# Patient Record
Sex: Female | Born: 1980 | Race: White | Hispanic: No | Marital: Single | Smoking: Never smoker
Health system: Southern US, Community
[De-identification: ages and names within clinical notes are randomized; demographics above are authoritative.]

## PROBLEM LIST (undated history)

## (undated) DIAGNOSIS — J45909 Unspecified asthma, uncomplicated: Secondary | ICD-10-CM

## (undated) DIAGNOSIS — U071 COVID-19: Secondary | ICD-10-CM

## (undated) DIAGNOSIS — L309 Dermatitis, unspecified: Secondary | ICD-10-CM

## (undated) HISTORY — DX: COVID-19: U07.1

## (undated) HISTORY — DX: Dermatitis, unspecified: L30.9

## (undated) HISTORY — DX: Unspecified asthma, uncomplicated: J45.909

---

## 2004-09-16 ENCOUNTER — Ambulatory Visit: Payer: Self-pay | Admitting: Cardiology

## 2019-10-10 ENCOUNTER — Ambulatory Visit (INDEPENDENT_AMBULATORY_CARE_PROVIDER_SITE_OTHER): Payer: Medicaid Other | Admitting: Internal Medicine

## 2019-10-10 ENCOUNTER — Encounter: Payer: Self-pay | Admitting: Internal Medicine

## 2019-10-10 ENCOUNTER — Other Ambulatory Visit: Payer: Self-pay

## 2019-10-10 DIAGNOSIS — R0609 Other forms of dyspnea: Secondary | ICD-10-CM | POA: Insufficient documentation

## 2019-10-10 DIAGNOSIS — J45991 Cough variant asthma: Secondary | ICD-10-CM | POA: Diagnosis not present

## 2019-10-10 DIAGNOSIS — R0600 Dyspnea, unspecified: Secondary | ICD-10-CM

## 2019-10-10 MED ORDER — PREDNISONE 10 MG PO TABS: ORAL_TABLET | ORAL | 0 refills | Status: DC

## 2019-10-10 MED ORDER — BENZONATATE 200 MG PO CAPS: 200.0000 mg | ORAL_CAPSULE | Freq: Three times a day (TID) | ORAL | 1 refills | Status: DC | PRN

## 2019-10-10 MED ORDER — PANTOPRAZOLE SODIUM 40 MG PO TBEC: 40.0000 mg | DELAYED_RELEASE_TABLET | Freq: Every day | ORAL | 2 refills | Status: DC

## 2019-10-10 MED ORDER — FAMOTIDINE 20 MG PO TABS: ORAL_TABLET | ORAL | 11 refills | Status: DC

## 2019-10-10 NOTE — Assessment & Plan Note (Signed)
Onset seasonal cough ? Around 2018/ mostly in fall - cyclical cough rx 10/10/2019   Of the three most common causes of  Sub-acute / recurrent or chronic cough, only one (GERD)  can actually contribute to/ trigger  the other two (asthma and post nasal drip syndrome)  and perpetuate the cylce of cough.  While not intuitively obvious, many patients with chronic low grade reflux do not cough until there is a primary insult that disturbs the protective epithelial barrier and exposes sensitive nerve endings.   This is typically viral but can due to PNDS and  either may apply here.     >>> The point is that once this occurs, it is difficult to eliminate the cycle  using anything but a maximally effective acid suppression regimen at least in the short run, accompanied by an appropriate diet to address non acid GERD and control / eliminate the cough itself if possible with tessalon pearls and avoid all cough drops plus added 6 days of Prednisone in case of component of Th-2 driven upper or lower airways inflammation (if cough responds short term only to relapse befor return while will on rx for uacs that would point to allergic rhinitis/ asthma or eos bronchitis)

## 2019-10-10 NOTE — Patient Instructions (Addendum)
Pantoprazole (protonix) 40 mg   Take  30-60 min before first meal of the day and Pepcid (famotidine)  20 mg one after supper  until return to office - this is the best way to tell whether stomach acid is contributing to your problem.     GERD (REFLUX)  is an extremely common cause of respiratory symptoms just like yours , many times with no obvious heartburn at all.    It can be treated with medication, but also with lifestyle changes including elevation of the head of your bed (ideally with 6 -8inch blocks under the headboard of your bed),  Smoking cessation, avoidance of late meals, excessive alcohol, and avoid fatty foods, chocolate, peppermint, colas, red wine, and acidic juices such as orange juice.  NO MINT OR MENTHOL PRODUCTS SO NO COUGH DROPS  USE SUGARLESS CANDY INSTEAD (Jolley ranchers or Stover's or Life Savers) or even ice chips will also do - the key is to swallow to prevent all throat clearing. NO OIL BASED VITAMINS - use powdered substitutes.  Avoid fish oil when coughing.    Prednisone 10 mg take  4 each am x 2 days,   2 each am x 2 days,  1 each am x 2 days and stop    For cough > tessalon 200 mg every 6 hours as needed            Please schedule a follow up office visit in 3 weeks in [place] office , sooner if needed  with all medications /inhalers/ solutions in hand so we can verify exactly what you are taking. This includes all medications from all doctors and over the counters

## 2019-10-10 NOTE — Progress Notes (Signed)
Shelley Richardson, female    DOB: 1981-04-09, 39 y.o.   MRN: [id number]   Brief patient profile:  68 yowf never smoker never resp problem with last IUP 8546 no resp complications with baseline wt low 300's  new onset doe / cough typically in fall but not require rx or inhalers typical fall symptoms in Nov 2020 then xmas 2020 > dec 29th pos covid then admitted [facility] Jan 3rd 2021  rx 02/ abx / steroids / anticoagulants and h2 eval by [facility] 07/27/19 by then had weaned off 02 and rx and rx augmentin/ prednisone since then good days and bad days with doe  With more cough on bad days some better transiently on pred but not consistent referred to pulmonary clinic 10/10/2019 by Dr   Nadara Mustard      History of Present Illness  10/10/2019  Pulmonary/ 1st office eval/Julius Boniface  Chief Complaint  Patient presents with  . Pulmonary Consult    Referred by [facility]. Pt c/o cough, SOB, HA, CP since had Covod 19 in Dec 2020. Her cough is non prod.    Dyspnea:  Maybe 50-100 ft flat  Cough: sporadic,  Worse at hs/ uses lots of cough drops  Sleep: lie flat 2 pillows  SABA use: not using, doesn't help  No obvious day to day or daytime variability or assoc excess/ purulent sputum or mucus plugs or hemoptysis or cp or chest tightness, subjective wheeze or overt sinus or hb symptoms.   Also denies any obvious fluctuation of symptoms with weather or environmental changes or other aggravating or alleviating factors except as outlined above   No unusual exposure hx or h/o childhood pna/ asthma or knowledge of premature birth.  Current Allergies, Complete Past Medical History, Past Surgical History, Family History, and Social History were reviewed in [employer] record.  ROS  The following are not active complaints unless bolded Hoarseness, sore throat, dysphagia, dental problems, itching, sneezing,  nasal congestion or discharge of excess mucus or purulent  secretions, ear ache,   fever, chills, sweats, unintended wt loss or wt gain, classically pleuritic or exertional cp,  orthopnea pnd or arm/hand swelling  or leg swelling, presyncope, palpitations, abdominal pain, anorexia, nausea, vomiting, diarrhea  or change in bowel habits or change in bladder habits, change in stools or change in urine, dysuria, hematuria,  rash, arthralgias, visual complaints, headache, numbness, weakness or ataxia or problems with walking or coordination,  change in mood or  memory.           No past medical history on file.  Outpatient Medications Prior to Visit  Medication Sig Dispense Refill  . Ascorbic Acid (VITAMIN C) 100 MG tablet Take 100 mg by mouth daily.    . cholecalciferol (VITAMIN D3) 25 MCG (1000 UNIT) tablet Take 1,000 Units by mouth daily.    Marland Kitchen zinc gluconate 50 MG tablet Take 50 mg by mouth daily.     No facility-administered medications prior to visit.     Objective:     BP 126/84 (BP Location: Left Arm, Cuff Size: Large)   Pulse 97   Temp (!) 97.3 F (36.3 C) (Temporal)   Wt (!) 360 lb (163.3 kg)   SpO2 97% Comment: on RA  SpO2: 97 %(on RA)   Pleasant amb obese wf nad     HEENT : pt wearing mask not removed for exam due to covid -19 concerns.    NECK :  without JVD/Nodes/TM/ nl carotid  upstrokes bilaterally   LUNGS: no acc muscle use,  Nl contour chest which is clear to A and P bilaterally without cough on insp or exp maneuvers   CV:  RRR  no s3 or murmur or increase in P2, and  Bilateral sym LE swelling but not pitting   ABD:  soft and nontender with nl inspiratory excursion in the supine position. No bruits or organomegaly appreciated, bowel sounds nl  MS:  Nl gait/ ext warm without deformities, calf tenderness, cyanosis or clubbing No obvious joint restrictions   SKIN: warm and dry without lesions    NEURO:  alert, approp, nl sensorium with  no motor or cerebellar deficits apparent.       Labs/ xrays unavailable at ov  due to pt transport restrictions > will schedule for next ov in [place] so we can do everything under one roof.      Assessment   DOE (dyspnea on exertion) Typically assoc with "seasonal cough"  Much worse since covid 19 xmas 2021 - 10/10/2019   Walked RA x 3 laps =  approx 1560ft @ nl pace - stopped due to end of study s sob with sats of 95  % at the end of the study.  Symptoms are markedly disproportionate to objective findings and not clear to what extent this is actually a pulmonary  problem but pt does appear to have difficult to sort out respiratory symptoms of unknown origin for which  DDX  = almost all start with A and  include Adherence, Ace Inhibitors, Acid Reflux, Active Sinus Disease, Alpha 1 Antitripsin deficiency, Anxiety masquerading as Airways dz,  ABPA,  Allergy(esp in young), Aspiration (esp in elderly), Adverse effects of meds,  Active smoking or Vaping, A bunch of PE's/clot burden (a few small clots can't cause this syndrome unless there is already severe underlying pulm or vascular dz with poor reserve),  Anemia or thyroid disorder, plus two Bs  = Bronchiectasis and Beta blocker use..and one C= CHF     Adherence is always the initial "prime suspect" and is a multilayered concern that requires a "trust but verify" approach in every patient - starting with knowing how to use medications, especially inhalers, correctly, keeping up with refills and understanding the fundamental difference between maintenance and prns vs those medications only taken for a very short course and then stopped and not refilled.  - return with all meds in hand using a trust but verify approach to confirm accurate Medication  Reconciliation The principal here is that until we are certain that the  patients are doing what we've asked, it makes no sense to ask them to do more.   ? Acid (or non-acid) GERD > always difficult to exclude as up to 75% of pts in some series report no assoc GI/ Heartburn symptoms>  rec max (24h)  acid suppression and diet restrictions/ reviewed and instructions given in writing.   ? Anxiety/depression/ deconditioning assoc with further wt gain  > usually at the bottom of this list of usual suspects but   may interfere with adherence and also interpretation of response or lack thereof to symptom management which can be quite subjective.   ? Anemia/ thyroid dz > check labs  ? A bunch of PEs > D dimer nl - while a normal  or high normal value (seen commonly in the elderly or chronically ill)  may miss small peripheral pe, the clot burden with sob is moderately high and the d dimer  has  a very high neg pred value if used in this setting but only useful if low  ? chf > check bnp      Cough variant asthma vs UACS  Onset seasonal cough ? Around 2018/ mostly in fall - cyclical cough rx 10/10/2019   Of the three most common causes of  Sub-acute / recurrent or chronic cough, only one (GERD)  can actually contribute to/ trigger  the other two (asthma and post nasal drip syndrome)  and perpetuate the cylce of cough.  While not intuitively obvious, many patients with chronic low grade reflux do not cough until there is a primary insult that disturbs the protective epithelial barrier and exposes sensitive nerve endings.   This is typically viral but can due to PNDS and  either may apply here.     >>> The point is that once this occurs, it is difficult to eliminate the cycle  using anything but a maximally effective acid suppression regimen at least in the short run, accompanied by an appropriate diet to address non acid GERD and control / eliminate the cough itself if possible with tessalon pearls and avoid all cough drops plus added 6 days of Prednisone in case of component of Th-2 driven upper or lower airways inflammation (if cough responds short term only to relapse befor return while will on rx for uacs that would point to allergic rhinitis/ asthma or eos bronchitis)        Morbid obesity due to excess calories (HCC) Baseline wt around 300 lb after last IUP around 2013   Contributing to gerd risk/ doe/reviewed the need and the process to achieve and maintain neg calorie balance > defer f/u primary care including intermittently monitoring thyroid status        Each maintenance medication was reviewed in detail including emphasizing most importantly the difference between maintenance and prns and under what circumstances the prns are to be triggered using an action plan format where appropriate.  Total time for H and P, chart review, counseling,  directly observing portions of ambulatory 02 saturation study/  and generating customized AVS unique to this office visit / charting = 60 min        Sandrea Hughs, MD 10/10/2019

## 2019-10-10 NOTE — Assessment & Plan Note (Signed)
Baseline wt around 300 lb after last IUP around 2013   Contributing to gerd risk/ doe/reviewed the need and the process to achieve and maintain neg calorie balance > defer f/u primary care including intermittently monitoring thyroid status        Each maintenance medication was reviewed in detail including emphasizing most importantly the difference between maintenance and prns and under what circumstances the prns are to be triggered using an action plan format where appropriate.  Total time for H and P, chart review, counseling,  directly observing portions of ambulatory 02 saturation study/  and generating customized AVS unique to this office visit / charting = 60 min

## 2019-10-10 NOTE — Assessment & Plan Note (Signed)
Typically assoc with "seasonal cough"  Much worse since covid 19 xmas 2021 - 10/10/2019   Walked RA x 3 laps =  approx 1572ft @ nl pace - stopped due to end of study s sob with sats of 95  % at the end of the study.  Symptoms are markedly disproportionate to objective findings and not clear to what extent this is actually a pulmonary  problem but pt does appear to have difficult to sort out respiratory symptoms of unknown origin for which  DDX  = almost all start with A and  include Adherence, Ace Inhibitors, Acid Reflux, Active Sinus Disease, Alpha 1 Antitripsin deficiency, Anxiety masquerading as Airways dz,  ABPA,  Allergy(esp in young), Aspiration (esp in elderly), Adverse effects of meds,  Active smoking or Vaping, A bunch of PE's/clot burden (a few small clots can't cause this syndrome unless there is already severe underlying pulm or vascular dz with poor reserve),  Anemia or thyroid disorder, plus two Bs  = Bronchiectasis and Beta blocker use..and one C= CHF     Adherence is always the initial "prime suspect" and is a multilayered concern that requires a "trust but verify" approach in every patient - starting with knowing how to use medications, especially inhalers, correctly, keeping up with refills and understanding the fundamental difference between maintenance and prns vs those medications only taken for a very short course and then stopped and not refilled.  - return with all meds in hand using a trust but verify approach to confirm accurate Medication  Reconciliation The principal here is that until we are certain that the  patients are doing what we've asked, it makes no sense to ask them to do more.   ? Acid (or non-acid) GERD > always difficult to exclude as up to 75% of pts in some series report no assoc GI/ Heartburn symptoms> rec max (24h)  acid suppression and diet restrictions/ reviewed and instructions given in writing.   ? Anxiety/depression/ deconditioning assoc with further wt  gain  > usually at the bottom of this list of usual suspects but   may interfere with adherence and also interpretation of response or lack thereof to symptom management which can be quite subjective.   ? Anemia/ thyroid dz > check labs  ? A bunch of PEs > D dimer nl - while a normal  or high normal value (seen commonly in the elderly or chronically ill)  may miss small peripheral pe, the clot burden with sob is moderately high and the d dimer  has a very high neg pred value if used in this setting but only useful if low  ? chf > check bnp

## 2019-11-07 ENCOUNTER — Ambulatory Visit: Payer: Medicaid Other | Admitting: Internal Medicine

## 2019-11-18 ENCOUNTER — Other Ambulatory Visit: Payer: Self-pay

## 2019-11-18 ENCOUNTER — Ambulatory Visit: Payer: Medicaid Other

## 2019-11-18 ENCOUNTER — Ambulatory Visit (INDEPENDENT_AMBULATORY_CARE_PROVIDER_SITE_OTHER): Payer: Medicaid Other | Admitting: Internal Medicine

## 2019-11-18 ENCOUNTER — Encounter: Payer: Self-pay | Admitting: Internal Medicine

## 2019-11-18 DIAGNOSIS — J45991 Cough variant asthma: Secondary | ICD-10-CM | POA: Diagnosis not present

## 2019-11-18 DIAGNOSIS — R0609 Other forms of dyspnea: Secondary | ICD-10-CM

## 2019-11-18 DIAGNOSIS — R0600 Dyspnea, unspecified: Secondary | ICD-10-CM

## 2019-11-18 NOTE — Progress Notes (Signed)
Shelley Richardson, female    DOB: [birth year], 39 y.o.   MRN: [id number]   Brief patient profile:  67 yowf never smoker never resp problem with last IUP 2013 no resp complications with baseline wt low 300's  new onset doe / cough typically in fall but not require rx or inhalers typical fall symptoms in Nov 2020 then xmas 2020 > dec 29th pos covid then admitted [facility] Jan 3rd 2021  rx 02/ abx / steroids / anticoagulants and h2 eval by [facility] 07/27/19 by then had weaned off 02 and rx and rx augmentin/ prednisone since then good days and bad days with doe  With more cough on bad days some better transiently on pred but not consistent referred to pulmonary clinic 10/10/2019 by Dr   Dimas Aguas      History of Present Illness  10/10/2019  Pulmonary/ 1st office eval/Shelley Richardson  Chief Complaint  Patient presents with  . Pulmonary Consult    Referred by [facility]. Pt c/o cough, SOB, HA, CP since had Covod 19 in Dec 2020. Her cough is non prod.    Dyspnea:  Maybe 50-100 ft flat  Cough: sporadic,  Worse at hs/ uses lots of cough drops  Sleep: lie flat 2 pillows  SABA use: not using, doesn't help rec Pantoprazole (protonix) 40 mg   Take  30-60 min before first meal of the day and Pepcid (famotidine)  20 mg one after supper  until return to office - this is the best way to tell whether stomach acid is contributing to your problem.   GERD diet  Prednisone 10 mg take  4 each am x 2 days,   2 each am x 2 days,  1 each am x 2 days and stop  For cough > tessalon 200 mg every 6 hours as needed     11/18/2019  f/u ov/Shelley Richardson re: post covid cough  Chief Complaint  Patient presents with  . Follow-up    pt states sob wwhen walking long distance.  Dyspnea:  Making slow progress Cough: better / some tickle /some sensation of pills getting stuck Sleeping: flat with 2 pillows  SABA use: none 02: none    No obvious day to day or daytime variability or assoc excess/ purulent sputum or  mucus plugs or hemoptysis or cp or chest tightness, subjective wheeze or overt sinus or hb symptoms.   Sleeping as above without nocturnal  or early am exacerbation  of respiratory  c/o's or need for noct saba. Also denies any obvious fluctuation of symptoms with weather or environmental changes or other aggravating or alleviating factors except as outlined above   No unusual exposure hx or h/o childhood pna/ asthma or knowledge of premature birth.  Current Allergies, Complete Past Medical History, Past Surgical History, Family History, and Social History were reviewed in [employer] record.  ROS  The following are not active complaints unless bolded Hoarseness, sore throat, dysphagia, dental problems, itching, sneezing,  nasal congestion or discharge of excess mucus or purulent secretions, ear ache,   fever, chills, sweats, unintended wt loss or wt gain, classically pleuritic or exertional cp,  orthopnea pnd or arm/hand swelling  or leg swelling, presyncope, palpitations, abdominal pain, anorexia, nausea, vomiting, diarrhea  or change in bowel habits or change in bladder habits, change in stools or change in urine, dysuria, hematuria,  rash, arthralgias, visual complaints, headache, numbness, weakness or ataxia or problems with walking or coordination,  change in mood  or  memory.        Current Meds  Medication Sig  . Ascorbic Acid (VITAMIN C) 100 MG tablet Take 100 mg by mouth daily.  . benzonatate (TESSALON) 200 MG capsule Take 1 capsule (200 mg total) by mouth 3 (three) times daily as needed for cough.  . cholecalciferol (VITAMIN D3) 25 MCG (1000 UNIT) tablet Take 1,000 Units by mouth daily.  . famotidine (PEPCID) 20 MG tablet One after supper  . zinc gluconate 50 MG tablet Take 50 mg by mouth daily.                        Objective:     amb obese wf nad   Wt Readings from Last 3 Encounters:  11/18/19 (!) 357 lb 4.8 oz (162.1 kg)  10/10/19 (!) 360 lb  (163.3 kg)     Vital signs reviewed - Note on arrival 02 sats  95% on RA      HEENT : pt wearing mask not removed for exam due to covid -19 concerns.    NECK :  without JVD/Nodes/TM/ nl carotid upstrokes bilaterally   LUNGS: no acc muscle use,  Nl contour chest which is clear to A and P bilaterally without cough on insp or exp maneuvers   CV:  RRR  no s3 or murmur or increase in P2, and no edema   ABD:  Quite obese but soft and nontender with limited insp excursion position. No bruits or organomegaly appreciated, bowel sounds nl  MS:  Nl gait/ ext warm without deformities, calf tenderness, cyanosis or clubbing No obvious joint restrictions   SKIN: warm and dry without lesions    NEURO:  alert, approp, nl sensorium with  no motor or cerebellar deficits apparent.    CXR PA and Lateral:   11/18/2019 :   Ordered       did not go for cxr due to transportation issues - could not be done in office due to wt limitations.              Assessment

## 2019-11-18 NOTE — Patient Instructions (Signed)
To get the most out of exercise, you need to be continuously aware that you are short of breath, but never out of breath, for at least 20 minutes daily. As you improve, it will actually be easier for you to do the same amount of exercise  in  30 minutes so always push to the level where you are short of breath.    No change in medications  Please remember to go to the  x-ray department  for your tests - we will call you with the results when they are available    Please schedule a follow up office visit in 8  weeks, sooner if needed

## 2019-11-19 ENCOUNTER — Encounter: Payer: Self-pay | Admitting: Internal Medicine

## 2019-11-19 NOTE — Assessment & Plan Note (Signed)
Baseline wt around 300 lb a plast IUP around 2013   Body mass index is 63.29 kg/m.  -  trending down slightly/ re-inforced No results found for: TSH   Contributing to gerd risk/ doe/reviewed the need and the process to achieve and maintain neg calorie balance > defer f/u primary care including intermittently monitoring thyroid status

## 2019-11-19 NOTE — Assessment & Plan Note (Signed)
Onset seasonal cough ? Around 2018/ mostly in fall - cyclical cough rx 10/10/2019   May benefit from allergy w/u when lab open again but she has transportation issues and all studies need to be done "in house"   Re-evaluate in 2 months         Each maintenance medication was reviewed in detail including emphasizing most importantly the difference between maintenance and prns and under what circumstances the prns are to be triggered using an action plan format where appropriate.  Total time for H and P, chart review, counseling,  directly observing portions of ambulatory 02 saturation study/  and generating customized AVS unique to this office visit / charting = 20 min

## 2019-11-19 NOTE — Assessment & Plan Note (Signed)
Typically assoc with "seasonal cough"  Much worse since covid 19 xmas 2021 - 10/10/2019   Walked RA x 3 laps =  approx 567ft @ nl pace - stopped due to end of study s sob with sats of 95  % at the end of the study. -  11/18/2019   Walked RA 3 laps @ approx 247ft each @ slow pace  stopped due to end of study, not limited by sob and sats 97% at end  Clearly improving at this point and probably  Does not need further w/u.  rec reg submax ex up to 30 min daily as tol and f/u in 2 months

## 2019-11-24 ENCOUNTER — Other Ambulatory Visit: Payer: Self-pay | Admitting: Internal Medicine

## 2019-11-24 DIAGNOSIS — J45991 Cough variant asthma: Secondary | ICD-10-CM

## 2019-11-28 ENCOUNTER — Ambulatory Visit (INDEPENDENT_AMBULATORY_CARE_PROVIDER_SITE_OTHER)
Admission: RE | Admit: 2019-11-28 | Discharge: 2019-11-28 | Disposition: A | Discharge status: Home / Self Care | Payer: Medicaid Other | Source: Ambulatory Visit | Attending: Internal Medicine | Admitting: Internal Medicine

## 2019-11-28 ENCOUNTER — Other Ambulatory Visit: Payer: Self-pay

## 2019-11-28 DIAGNOSIS — R0609 Other forms of dyspnea: Secondary | ICD-10-CM

## 2019-11-28 DIAGNOSIS — R0600 Dyspnea, unspecified: Secondary | ICD-10-CM

## 2019-11-29 NOTE — Progress Notes (Signed)
Called and left detailed msg on machine ok per DPR.

## 2019-12-22 ENCOUNTER — Other Ambulatory Visit: Payer: Self-pay | Admitting: Internal Medicine

## 2019-12-22 DIAGNOSIS — J45991 Cough variant asthma: Secondary | ICD-10-CM

## 2020-01-06 ENCOUNTER — Ambulatory Visit (INDEPENDENT_AMBULATORY_CARE_PROVIDER_SITE_OTHER): Payer: Medicaid Other | Admitting: Internal Medicine

## 2020-01-06 ENCOUNTER — Encounter: Payer: Self-pay | Admitting: Internal Medicine

## 2020-01-06 ENCOUNTER — Other Ambulatory Visit: Payer: Self-pay

## 2020-01-06 DIAGNOSIS — R0600 Dyspnea, unspecified: Secondary | ICD-10-CM | POA: Diagnosis not present

## 2020-01-06 DIAGNOSIS — J45991 Cough variant asthma: Secondary | ICD-10-CM | POA: Diagnosis not present

## 2020-01-06 DIAGNOSIS — R0609 Other forms of dyspnea: Secondary | ICD-10-CM

## 2020-01-06 LAB — URINALYSIS
Bilirubin Urine: NEGATIVE
Hgb urine dipstick: NEGATIVE
Ketones, ur: NEGATIVE
Leukocytes,Ua: NEGATIVE
Nitrite: NEGATIVE
Specific Gravity, Urine: 1.025 (ref 1.000–1.030)
Total Protein, Urine: NEGATIVE
Urine Glucose: NEGATIVE
Urobilinogen, UA: 0.2 (ref 0.0–1.0)
pH: 5.5 (ref 5.0–8.0)

## 2020-01-06 LAB — CBC WITH DIFFERENTIAL/PLATELET
Basophils Absolute: 0 10*3/uL (ref 0.0–0.1)
Basophils Relative: 0.6 % (ref 0.0–3.0)
Eosinophils Absolute: 0.1 10*3/uL (ref 0.0–0.7)
Eosinophils Relative: 1.5 % (ref 0.0–5.0)
HCT: 36.9 % (ref 36.0–46.0)
Hemoglobin: 12.1 g/dL (ref 12.0–15.0)
Lymphocytes Relative: 33 % (ref 12.0–46.0)
Lymphs Abs: 2.8 10*3/uL (ref 0.7–4.0)
MCHC: 32.9 g/dL (ref 30.0–36.0)
MCV: 90.1 fl (ref 78.0–100.0)
Monocytes Absolute: 0.5 10*3/uL (ref 0.1–1.0)
Monocytes Relative: 6 % (ref 3.0–12.0)
Neutro Abs: 5 10*3/uL (ref 1.4–7.7)
Neutrophils Relative %: 58.9 % (ref 43.0–77.0)
Platelets: 340 10*3/uL (ref 150.0–400.0)
RBC: 4.1 Mil/uL (ref 3.87–5.11)
RDW: 14.5 % (ref 11.5–15.5)
WBC: 8.4 10*3/uL (ref 4.0–10.5)

## 2020-01-06 LAB — SEDIMENTATION RATE: Sed Rate: 9 mm/hr (ref 0–20)

## 2020-01-06 LAB — BASIC METABOLIC PANEL
BUN: 12 mg/dL (ref 6–23)
CO2: 27 mEq/L (ref 19–32)
Calcium: 9.3 mg/dL (ref 8.4–10.5)
Chloride: 105 mEq/L (ref 96–112)
Creatinine, Ser: 0.82 mg/dL (ref 0.40–1.20)
GFR: 77.66 mL/min (ref >60.00)
Glucose, Bld: 111 mg/dL — ABNORMAL HIGH (ref 70–99)
Potassium: 4.1 mEq/L (ref 3.5–5.1)
Sodium: 139 mEq/L (ref 135–145)

## 2020-01-06 LAB — TSH: TSH: 2.48 u[IU]/mL (ref 0.35–4.50)

## 2020-01-06 LAB — BRAIN NATRIURETIC PEPTIDE: Pro B Natriuretic peptide (BNP): 22 pg/mL (ref 0.0–100.0)

## 2020-01-06 MED ORDER — FAMOTIDINE 20 MG PO TABS: ORAL_TABLET | ORAL | 11 refills | Status: DC

## 2020-01-06 MED ORDER — BENZONATATE 200 MG PO CAPS: ORAL_CAPSULE | ORAL | 0 refills | Status: DC

## 2020-01-06 NOTE — Patient Instructions (Addendum)
Stop pantoprazole and start taking pepcid (famotidine) after first meal and last meal.   Please remember to go to the lab department   for your tests - we will call you with the results when they are available.   Please schedule a follow up office visit in 6 weeks, call sooner if needed in [place] office

## 2020-01-06 NOTE — Progress Notes (Signed)
Shelley Richardson, female    DOB: Mar 17, 1981, 39 y.o.   MRN: [id number]   Brief patient profile:  48 yowf never smoker never resp problem with last IUP 2013 no resp complications with baseline wt low 300's  new onset doe / cough typically in fall but not require rx or inhalers typical fall symptoms in Nov 2020 then xmas 2020 > dec 29th pos covid then admitted [facility] Jan 3rd 2021  rx 02/ abx / steroids / anticoagulants and h2 eval by [facility] 07/27/19 by then had weaned off 02 and rx and rx augmentin/ prednisone since then good days and bad days with doe  With more cough on bad days some better transiently on pred but not consistent referred to pulmonary clinic 10/10/2019 by Dr   Dimas Aguas      History of Present Illness  10/10/2019  Pulmonary/ 1st office eval/Sunnie Odden  p covid Dec 2020  Chief Complaint  Patient presents with  . Pulmonary Consult    Referred by [facility]. Pt c/o cough, SOB, HA, CP since had Covod 19 in Dec 2020. Her cough is non prod.    Dyspnea:  Maybe 50-100 ft flat  Cough: sporadic,  Worse at hs/ uses lots of cough drops  Sleep: lie flat 2 pillows  SABA use: not using, doesn't help rec Pantoprazole (protonix) 40 mg   Take  30-60 min before first meal of the day and Pepcid (famotidine)  20 mg one after supper  until return to office - this is the best way to tell whether stomach acid is contributing to your problem.   GERD diet  Prednisone 10 mg take  4 each am x 2 days,   2 each am x 2 days,  1 each am x 2 days and stop  For cough > tessalon 200 mg every 6 hours as needed     11/18/2019  f/u ov/[facility] re: post covid cough  Chief Complaint  Patient presents with  . Follow-up    pt states sob wwhen walking long distance.  Dyspnea:  Making slow progress Cough: better / some tickle /some sensation of pills getting stuck Sleeping: flat with 2 pillows  SABA use: none 02: none  rec To get the most out of exercise, you need to be continuously  aware that you are short of breath, but never out of breath, for at least 20 minutes daily. As you improve, it will actually be easier for you to do the same amount of exercise  in  30 minutes so always push to the level where you are short of breath.   No change in medications Please remember to go to the  x-ray department  for your tests - we will call you with the results when they are available   Please schedule a follow up office visit in 8  weeks, sooner if needed      01/06/2020  f/u ov/Allora Bains re: post covid Dec 2020  Chief Complaint  Patient presents with  . Follow-up   cp x years lasts from sev  hours to all day  Worse with  drinking cold drinks /with deep breath / not noct  Dyspnea: not very active gets let off at door but better since last ov  Cough: off and on s pattern, no excess mucus   Sleeping: on side /not using bed blocks  SABA use: none  02:  None    No obvious day to day or daytime variability or assoc  excess/ purulent sputum or mucus plugs or hemoptysis   or chest tightness, subjective wheeze or overt sinus or hb symptoms.   Sleeping  without nocturnal  or early am exacerbation  of respiratory  c/o's or need for noct saba. Also denies any obvious fluctuation of symptoms with weather or environmental changes or other aggravating or alleviating factors except as outlined above   No unusual exposure hx or h/o childhood pna/ asthma or knowledge of premature birth.  Current Allergies, Complete Past Medical History, Past Surgical History, Family History, and Social History were reviewed in [employer] record.  ROS  The following are not active complaints unless bolded Hoarseness, sore throat, dysphagia= globus sensatin , dental problems, itching, sneezing,  nasal congestion or discharge of excess mucus or purulent secretions, ear ache,   fever, chills, sweats, unintended wt loss or wt gain, classically exertional cp,  orthopnea pnd or arm/hand swelling   or leg swelling, presyncope, palpitations, abdominal pain, anorexia, nausea, vomiting, diarrhea  or change in bowel habits or change in bladder habits=nocturia, change in stools or change in urine, dysuria, hematuria,  rash, arthralgias, visual complaints, headache, numbness, weakness or ataxia or problems with walking or coordination,  change in mood or  memory.        Current Meds  Medication Sig  . Ascorbic Acid (VITAMIN C) 100 MG tablet Take 100 mg by mouth daily.  . benzonatate (TESSALON) 200 MG capsule Take 1 capsule by mouth three times daily as needed for cough  . cholecalciferol (VITAMIN D3) 25 MCG (1000 UNIT) tablet Take 1,000 Units by mouth daily.  . famotidine (PEPCID) 20 MG tablet One after supper  . pantoprazole (PROTONIX) 40 MG tablet Take 1 tablet (40 mg total) by mouth daily. Take 30-60 min before first meal of the day  . zinc gluconate 50 MG tablet Take 50 mg by mouth daily.                    Objective:       Obese wf nad    01/06/2020       356   11/18/19 (!) 357 lb 4.8 oz (162.1 kg)  10/10/19 (!) 360 lb (163.3 kg)      Vital signs reviewed  01/06/2020  - Note at rest 02 sats  100% on RA   HEENT : pt wearing mask not removed for exam due to covid -19 concerns.    NECK :  without JVD/Nodes/TM/ nl carotid upstrokes bilaterally   LUNGS: no acc muscle use,  Nl contour chest which is clear to A and P bilaterally without cough on insp or exp maneuvers   CV:  RRR  no s3 or murmur or increase in P2, and trace pitting both LEs   ABD:  Obese/ soft and nontender with very limited inspiratory excursion No bruits or organomegaly appreciated, bowel sounds nl  MS:  Nl gait/ ext warm without deformities, calf tenderness, cyanosis or clubbing No obvious joint restrictions   SKIN: warm and dry without lesions    NEURO:  alert, approp, nl sensorium with  no motor or cerebellar deficits apparent.       Labs ordered/ reviewed:      Chemistry      Component  Value Date/Time   NA 139 01/06/2020 1009   K 4.1 01/06/2020 1009   CL 105 01/06/2020 1009   CO2 27 01/06/2020 1009   BUN 12 01/06/2020 1009   CREATININE 0.82 01/06/2020 1009  Component Value Date/Time   CALCIUM 9.3 01/06/2020 1009        Lab Results  Component Value Date   WBC 8.4 01/06/2020   HGB 12.1 01/06/2020   HCT 36.9 01/06/2020   MCV 90.1 01/06/2020   PLT 340.0 01/06/2020       EOS                                                              0.1                                     01/06/2020      Lab Results  Component Value Date   TSH 2.48 01/06/2020     Lab Results  Component Value Date   PROBNP 22.0 01/06/2020       Lab Results  Component Value Date   ESRSEDRATE 9 01/06/2020       U/a 01/06/2020  Neg   Labs ordered 01/06/2020  :  allergy profile       Assessment

## 2020-01-08 ENCOUNTER — Encounter: Payer: Self-pay | Admitting: Internal Medicine

## 2020-01-08 NOTE — Assessment & Plan Note (Signed)
Baseline wt around 300 lb a plast IUP around 2013   Body mass index is 63.17 kg/m.  -  trending no change  Lab Results  Component Value Date   TSH 2.48 01/06/2020     Contributing to gerd risk/ doe/reviewed the need and the process to achieve and maintain neg calorie balance > defer f/u primary care including intermittently monitoring thyroid status      Medical decision making was a moderate level of complexity in this case because of  two chronic conditions /diagnoses requiring extra time for  H and P, chart review, counseling,   and generating customized AVS unique to this office visit and charting.   Each maintenance medication was reviewed in detail including emphasizing most importantly the difference between maintenance and prns and under what circumstances the prns are to be triggered using an action plan format where appropriate. Please see avs for details which were reviewed in writing by both me and my nurse and patient given a written copy highlighted where appropriate with yellow highlighter for the patient's continued care at home along with an updated version of their medications.  Patient was asked to maintain medication reconciliation by comparing this list to the actual medications being used at home and to contact this office right away if there is a conflict or discrepancy.      Marland Kitchen

## 2020-01-08 NOTE — Assessment & Plan Note (Signed)
Typically assoc with "seasonal cough"  Much worse since covid 19 xmas 2021 - 10/10/2019   Walked RA x 3 laps =  approx 517ft @ nl pace - stopped due to end of study s sob with sats of 95  % at the end of the study. -  11/18/2019   Walked RA 3 laps @ approx 237ft each @ slow pace  stopped due to end of study, not limited by sob and sats 97% at end    No evidence of chf/ anemia/ thyroid dz/ renal failure  Strongly suspect all related to obesity/ deconditioning/ advised

## 2020-01-08 NOTE — Assessment & Plan Note (Signed)
Onset seasonal cough ? Around 2018/ mostly in fall - cyclical cough rx 10/10/2019 > improved - Allergy profile 01/06/2020 >  Eos 0.1 /  IgE   - 01/06/2020 try changing ppi to h2 bid   Discussed the recent press about ppi's in the context of a statistically significant (but questionably clinically relevant) increase in CRI in pts on ppi vs h2's > bottom line is the lowest dose of ppi that controls   gerd is the right dose and if that dose is zero that's fine esp since h2's are cheaper.

## 2020-01-09 LAB — IGE: IgE (Immunoglobulin E), Serum: 7 kU/L (ref <=114)

## 2020-01-10 NOTE — Progress Notes (Signed)
Called and left detailed msg on machine OK per DPR

## 2020-02-17 ENCOUNTER — Encounter: Payer: Self-pay | Admitting: Internal Medicine

## 2020-02-17 ENCOUNTER — Other Ambulatory Visit: Payer: Self-pay

## 2020-02-17 ENCOUNTER — Ambulatory Visit: Payer: Medicaid Other | Admitting: Internal Medicine

## 2020-02-17 VITALS — BP 138/80 | HR 87 | Temp 97.8°F | Ht 63.0 in | Wt 353.0 lb

## 2020-02-17 DIAGNOSIS — J45991 Cough variant asthma: Secondary | ICD-10-CM

## 2020-02-17 DIAGNOSIS — R609 Edema, unspecified: Secondary | ICD-10-CM | POA: Diagnosis not present

## 2020-02-17 DIAGNOSIS — R0609 Other forms of dyspnea: Secondary | ICD-10-CM

## 2020-02-17 DIAGNOSIS — R0600 Dyspnea, unspecified: Secondary | ICD-10-CM

## 2020-02-17 MED ORDER — SPIRONOLACTONE-HCTZ 25-25 MG PO TABS: ORAL_TABLET | ORAL | 11 refills | Status: AC

## 2020-02-17 NOTE — Assessment & Plan Note (Signed)
Likely has venous insufficiency due to obesity, no evidence DVT but  At risk/ advised   >>> aldactazide 25-25  1-2 daily and f/u pcp

## 2020-02-17 NOTE — Progress Notes (Signed)
Shelley Richardson, female    DOB: [birth year], 39 y.o.   MRN: [id number]   Brief patient profile:  73 yowf never smoker never resp problem with last IUP 2013 no resp complications with baseline wt low 300's the around w/in a year of last IUP   new onset doe / cough typically in fall but not require rx or inhalers typical fall symptoms in Nov 2020 then xmas 2020 > dec 29th pos covid then admitted [facility] Jan 3rd 2021  rx 02/ abx / steroids / anticoagulants and h2 eval by [facility] 07/27/19 by then had weaned off 02 and rx and rx augmentin/ prednisone since then good days and bad days with doe  With more cough on bad days some better transiently on pred but not consistent referred to pulmonary clinic 10/10/2019 by Dr   Dimas Aguas      History of Present Illness  10/10/2019  Pulmonary/ 1st office eval/Shelley Richardson  p covid Dec 2020  Chief Complaint  Patient presents with  . Pulmonary Consult    Referred by [facility]. Pt c/o cough, SOB, HA, CP since had Covod 19 in Dec 2020. Her cough is non prod.    Dyspnea:  Maybe 50-100 ft flat  Cough: sporadic,  Worse at hs/ uses lots of cough drops  Sleep: lie flat 2 pillows  SABA use: not using, doesn't help rec Pantoprazole (protonix) 40 mg   Take  30-60 min before first meal of the day and Pepcid (famotidine)  20 mg one after supper  until return to office - this is the best way to tell whether stomach acid is contributing to your problem.   GERD diet  Prednisone 10 mg take  4 each am x 2 days,   2 each am x 2 days,  1 each am x 2 days and stop  For cough > tessalon 200 mg every 6 hours as needed     11/18/2019  f/u ov/Shelley Richardson re: post covid cough  Chief Complaint  Patient presents with  . Follow-up    pt states sob wwhen walking long distance.  Dyspnea:  Making slow progress Cough: better / some tickle /some sensation of pills getting stuck Sleeping: flat with 2 pillows  SABA use: none 02: none  rec To get the most out of  exercise, you need to be continuously aware that you are short of breath, but never out of breath, for at least 20 minutes daily. As you improve, it will actually be easier for you to do the same amount of exercise  in  30 minutes so always push to the level where you are short of breath.   No change in medications Please remember to go to the  x-ray department  for your tests - we will call you with the results when they are available   Please schedule a follow up office visit in 8  weeks, sooner if needed      01/06/2020  f/u ov/Shelley Richardson re: post covid Dec 2020  Chief Complaint  Patient presents with  . Follow-up   cp x years lasts from sev  hours to all day  Worse with  drinking cold drinks /with deep breath / not noct  Dyspnea: not very active gets let off at door but better since last ov  Cough: off and on s pattern, no excess mucus   Sleeping: on side /not using bed blocks  SABA use: none  02:  None  rec Stop pantopzole  and start taking pepcid (famotidine) after first meal and last meal. Please remember to go to the lab department   for your tests - we will call you with the results when they are available.       02/17/2020  f/u ov/[name] office/Shelley Richardson re:  Doe/ cough  And chest pain all predated covid by years now with leg swelling worse  Chief Complaint  Patient presents with  . Follow-up    Patient is still having some chest tightness, hurts to breath and feels like she is swollen. Shortness of breath with exertion. Covid in December. Cough is better with the tessalon perles.  Dyspnea: x 7 years / Cough: better less need for tessalon Sleeping: flat surface / one pillow / bed blocks not being used wakes up sev times a week with cough/ wheeze, sob and helps to sit up  SABA use: none  02: none  Cp midline x couple of years sometimes all day long = chest tightness worse since covid but present before covid seen in ER [facility] ? Better p prednisone > records req    No obvious day  to day or daytime variability or assoc excess/ purulent sputum or mucus plugs or hemoptysis or , subjective wheeze or overt sinus or hb symptoms.   sleepi without nocturnal  or early am exacerbation  of respiratory  c/o's or need for noct saba. Also denies any obvious fluctuation of symptoms with weather or environmental changes or other aggravating or alleviating factors except as outlined above   No unusual exposure hx or h/o childhood pna/ asthma or knowledge of premature birth.  Current Allergies, Complete Past Medical History, Past Surgical History, Family History, and Social History were reviewed in [employer] record.  ROS  The following are not active complaints unless bolded Hoarseness, sore throat, dysphagia, dental problems, itching, sneezing,  nasal congestion or discharge of excess mucus or purulent secretions, ear ache,   fever, chills, sweats, unintended wt loss or wt gain, classically pleuritic or exertional cp,  orthopnea pnd or arm/hand swelling  or leg swelling, presyncope, palpitations, abdominal pain, anorexia, nausea, vomiting, diarrhea  or change in bowel habits or change in bladder habits, change in stools or change in urine, dysuria, hematuria,  rash, arthralgias, visual complaints, headache, numbness, weakness or ataxia or problems with walking or coordination,  change in mood or  memory.        Current Meds  Medication Sig  . Ascorbic Acid (VITAMIN C) 100 MG tablet Take 100 mg by mouth daily.  . benzonatate (TESSALON) 200 MG capsule Take 1 capsule by mouth three times daily as needed for cough  . cholecalciferol (VITAMIN D3) 25 MCG (1000 UNIT) tablet Take 1,000 Units by mouth daily.  . famotidine (PEPCID) 20 MG tablet One after bfast and supper  . zinc gluconate 50 MG tablet Take 50 mg by mouth daily.         Objective:      obese somber amb wf nad    02/17/2020        353   01/06/2020       356   11/18/19 (!) 357 lb 4.8 oz (162.1 kg)   10/10/19 (!) 360 lb (163.3 kg)    Vital signs reviewed  02/17/2020  - Note at rest 02 sats  97% on RA       HEENT : pt wearing mask not removed for exam due to covid -19 concerns.    NECK :  without JVD/Nodes/TM/ nl carotid upstrokes bilaterally   LUNGS: no acc muscle use,  Nl contour chest which is clear to A and P bilaterally without cough on insp or exp maneuvers   CV:  RRR  no s3 or murmur or increase in P2, and 1-2+ pitting both ankles  ABD:  soft and nontender with nl inspiratory excursion in the supine position. No bruits or organomegaly appreciated, bowel sounds nl  MS:  Nl gait/ ext warm without deformities, calf tenderness, cyanosis or clubbing No obvious joint restrictions   SKIN: warm and dry without lesions    NEURO:  alert, approp, nl sensorium with  no motor or cerebellar deficits apparent.             Assessment

## 2020-02-17 NOTE — Patient Instructions (Addendum)
Aldactazide 25-25 take   1 or 2 each am and avoid salt and salty food   I will check your records from [facility] for your chest pain prior to covid 2019-2020  Please schedule a follow up office visit in 6-8  Weeks with PFTs on return

## 2020-02-17 NOTE — Assessment & Plan Note (Signed)
Onset seasonal cough ? Around 2018/ mostly in fall - cyclical cough rx 10/10/2019 > improved - Allergy profile 01/06/2020 >  Eos 0.1 /  IgE  7 - 01/06/2020 try changing ppi to h2 bid > no worse off PPI as of 02/17/2020   Upper airway cough syndrome (previously labeled PNDS),  is so named because it's frequently impossible to sort out how much is  CR/sinusitis with freq throat clearing (which can be related to primary GERD)   vs  causing  secondary (" extra esophageal")  GERD from wide swings in gastric pressure that occur with throat clearing, often  promoting self use of mint and menthol lozenges that reduce the lower esophageal sphincter tone and exacerbate the problem further in a cyclical fashion.   These are the same pts (now being labeled as having "irritable larynx syndrome" by some cough centers) who not infrequently have a history of having failed to tolerate ace inhibitors,  dry powder inhalers or biphosphonates or report having atypical/extraesophageal reflux symptoms that don't respond to standard doses of PPI  and are easily confused as having aecopd or asthma flares by even experienced allergists/ pulmonologists (myself included).   rec continue pepcid bid/ return for pfts before and after saba to see if any asthma component as not doing methacholine challenges at this time due to covid

## 2020-02-17 NOTE — Assessment & Plan Note (Signed)
Baseline wt around 300 lb a plast IUP around 2013   Body mass index is 62.53 kg/m.  -  trending down slightly encouraged Lab Results  Component Value Date   TSH 2.48 01/06/2020     Contributing to gerd risk/ doe/reviewed the need and the process to achieve and maintain neg calorie balance > defer f/u primary care including intermittently monitoring thyroid status            Each maintenance medication was reviewed in detail including emphasizing most importantly the difference between maintenance and prns and under what circumstances the prns are to be triggered using an action plan format where appropriate.  Total time for H and P, chart review, counseling,  directly observing portions of ambulatory 02 saturation study/ and generating customized AVS unique to this office visit / charting =  >  30 min

## 2020-02-17 NOTE — Assessment & Plan Note (Signed)
Typically assoc with "seasonal cough"  Much worse since covid 19 xmas 2021 - 10/10/2019   Walked RA x 3 laps =  approx 562ft @ nl pace - stopped due to end of study s sob with sats of 95  % at the end of the study. -  11/18/2019   Walked RA 3 laps @ approx 272ft each @ slow pace  stopped due to end of study, not limited by sob and sats 97% at end   -  02/17/2020   Walked RA  approx  300  ft  @ moderate pace  stopped due to end of study, min sob/tight with sats 99%    C/w obesity/ conditioning issues >  Needs pfts to complete the w/u

## 2020-03-10 ENCOUNTER — Other Ambulatory Visit: Payer: Self-pay | Admitting: Internal Medicine

## 2020-03-10 DIAGNOSIS — J45991 Cough variant asthma: Secondary | ICD-10-CM

## 2020-03-26 ENCOUNTER — Other Ambulatory Visit: Payer: Self-pay

## 2020-03-26 ENCOUNTER — Ambulatory Visit: Payer: Medicaid Other

## 2020-03-26 DIAGNOSIS — R0609 Other forms of dyspnea: Secondary | ICD-10-CM

## 2020-03-26 DIAGNOSIS — R609 Edema, unspecified: Secondary | ICD-10-CM

## 2020-03-26 DIAGNOSIS — J45991 Cough variant asthma: Secondary | ICD-10-CM

## 2020-03-26 DIAGNOSIS — R600 Localized edema: Secondary | ICD-10-CM

## 2020-04-02 ENCOUNTER — Other Ambulatory Visit (HOSPITAL_COMMUNITY): Payer: Medicaid Other

## 2020-04-13 ENCOUNTER — Ambulatory Visit (INDEPENDENT_AMBULATORY_CARE_PROVIDER_SITE_OTHER): Payer: Medicaid Other | Admitting: Internal Medicine

## 2020-04-13 ENCOUNTER — Encounter: Payer: Self-pay | Admitting: Internal Medicine

## 2020-04-13 ENCOUNTER — Other Ambulatory Visit: Payer: Self-pay

## 2020-04-13 DIAGNOSIS — J45991 Cough variant asthma: Secondary | ICD-10-CM

## 2020-04-13 DIAGNOSIS — R0600 Dyspnea, unspecified: Secondary | ICD-10-CM

## 2020-04-13 DIAGNOSIS — R0609 Other forms of dyspnea: Secondary | ICD-10-CM

## 2020-04-13 MED ORDER — PANTOPRAZOLE SODIUM 40 MG PO TBEC: 40.0000 mg | DELAYED_RELEASE_TABLET | Freq: Every day | ORAL | 2 refills | Status: DC

## 2020-04-13 MED ORDER — GABAPENTIN 100 MG PO CAPS
100.0000 mg | ORAL_CAPSULE | Freq: Three times a day (TID) | ORAL | 2 refills | Status: DC
Start: 2020-04-13 — End: 2020-09-25

## 2020-04-13 MED ORDER — FAMOTIDINE 20 MG PO TABS: ORAL_TABLET | ORAL | 11 refills | Status: DC

## 2020-04-13 NOTE — Patient Instructions (Addendum)
Gabapentin 100 mg three times  A day should gradually eliminate the throat pain and cough   To get the most out of exercise, you need to be continuously aware that you are short of breath, but never out of breath, for 30 minutes daily. As you improve, it will actually be easier for you to do the same amount of exercise  in  30 minutes so always push to the level where you are short of breath.    Pantoprazole (protonix) 40 mg   Take  30-60 min before first meal of the day and Pepcid (famotidine)  20 mg one @  bedtime until return to office - this is the best way to tell whether stomach acid is contributing to your problem.    We are referring you to an ENT doctor   Please schedule a follow up visit in 3 months but call sooner if needed

## 2020-04-13 NOTE — Assessment & Plan Note (Signed)
Onset seasonal cough ? Around 2018/ mostly in fall - cyclical cough rx 10/10/2019 > improved - Allergy profile 01/06/2020 >  Eos 0.1 /  IgE  7 - 01/06/2020 try changing ppi to h2 bid > no worse off PPI as of 02/17/2020  - 04/13/2020 added gabapentin 100 tid and back on gerd rx with Ent f/u   Aggravated with voice use and now with sustained globus and unexplained sob all typical of Upper airway cough syndrome (previously labeled PNDS),  is so named because it's frequently impossible to sort out how much is  CR/sinusitis with freq throat clearing (which can be related to primary GERD)   vs  causing  secondary (" extra esophageal")  GERD from wide swings in gastric pressure that occur with throat clearing, often  promoting self use of mint and menthol lozenges that reduce the lower esophageal sphincter tone and exacerbate the problem further in a cyclical fashion.   These are the same pts (now being labeled as having "irritable larynx syndrome" by some cough centers) who not infrequently have a history of having failed to tolerate ace inhibitors,  dry powder inhalers or biphosphonates or report having atypical/extraesophageal reflux symptoms that don't respond to standard doses of PPI  and are easily confused as having aecopd or asthma flares by even experienced allergists/ pulmonologists (myself included).   Of the three most common causes of  Sub-acute / recurrent or chronic cough, only one (GERD)  can actually contribute to/ trigger  the other two (asthma and post nasal drip syndrome)  and perpetuate the cylce of cough.  While not intuitively obvious, many patients with chronic low grade reflux do not cough until there is a primary insult that disturbs the protective epithelial barrier and exposes sensitive nerve endings.   This is typically viral but can due to PNDS and  either may apply here.   The point is that once this occurs, it is difficult to eliminate the cycle  using anything but a maximally  effective acid suppression regimen at least in the short run, accompanied by an appropriate diet to address non acid GERD and control / eliminate the cough itself with gabapentin titrated as high as 300 mg tid if need be.  >>> ent eval next step for sustained globus sensation           Each maintenance medication was reviewed in detail including emphasizing most importantly the difference between maintenance and prns and under what circumstances the prns are to be triggered using an action plan format where appropriate.  Total time for H and P, chart review, counseling,  directly observing portions of ambulatory 02 saturation study/  and generating customized AVS unique to this office visit / charting  > 30 min

## 2020-04-13 NOTE — Assessment & Plan Note (Signed)
Baseline wt around 300 lb at  last IUP around 2013   Body mass index is 58.73 kg/m.  -  trending down/ encouraged Lab Results  Component Value Date   TSH 2.48 01/06/2020     Contributing to gerd risk/ doe/reviewed the need and the process to achieve and maintain neg calorie balance > defer f/u primary care including intermittently monitoring thyroid status

## 2020-04-13 NOTE — Progress Notes (Signed)
Shelley Richardson, female    DOB: Oct 21, 1980, 39 y.o.   MRN: [id number]   Brief patient profile:  89 yowf never smoker never resp problem with last IUP 2013 no resp complications with baseline wt low 300's the around w/in a year of last IUP   new onset doe / cough typically in fall but not require rx or inhalers typical fall symptoms in Nov 2020 then xmas 2020 > dec 29th 2020 pos covid then admitted [facility] Jan 3rd 2021  rx 02/ abx / steroids / anticoagulants and h2 eval by [facility] 07/27/19 by then had weaned off 02 and rx and rx augmentin/ prednisone since then good days and bad days with doe  With more cough on bad days some better transiently on pred but not consistent referred to pulmonary clinic 10/10/2019 by Dr   Dimas Aguas      History of Present Illness  10/10/2019  Pulmonary/ 1st office eval/Shelley Richardson  p covid Dec 2020  Chief Complaint  Patient presents with  . Pulmonary Consult    Referred by [facility]. Pt c/o cough, SOB, HA, CP since had Covod 19 in Dec 2020. Her cough is non prod.    Dyspnea:  Maybe 50-100 ft flat  Cough: sporadic,  Worse at hs/ uses lots of cough drops  Sleep: lie flat 2 pillows  SABA use: not using, doesn't help rec Pantoprazole (protonix) 40 mg   Take  30-60 min before first meal of the day and Pepcid (famotidine)  20 mg one after supper  until return to office - this is the best way to tell whether stomach acid is contributing to your problem.   GERD diet  Prednisone 10 mg take  4 each am x 2 days,   2 each am x 2 days,  1 each am x 2 days and stop  For cough > tessalon 200 mg every 6 hours as needed     11/18/2019  f/u ov/Shelley Richardson re: post covid cough  Chief Complaint  Patient presents with  . Follow-up    pt states sob wwhen walking long distance.  Dyspnea:  Making slow progress Cough: better / some tickle /some sensation of pills getting stuck Sleeping: flat with 2 pillows  SABA use: none 02: none  rec To get the most out of  exercise, you need to be continuously aware that you are short of breath, but never out of breath, for at least 20 minutes daily. As you improve, it will actually be easier for you to do the same amount of exercise  in  30 minutes so always push to the level where you are short of breath.   No change in medications Please remember to go to the  x-ray department  for your tests - we will call you with the results when they are available   Please schedule a follow up office visit in 8  weeks, sooner if needed      01/06/2020  f/u ov/Shelley Richardson re: post covid Dec 2020  Chief Complaint  Patient presents with  . Follow-up   cp x years lasts from sev  hours to all day  Worse with  drinking cold drinks /with deep breath / not noct  Dyspnea: not very active gets let off at door but better since last ov  Cough: off and on s pattern, no excess mucus   Sleeping: on side /not using bed blocks  SABA use: none  02:  None  rec Stop  pantopzole and start taking pepcid (famotidine) after first meal and last meal. Please remember to go to the lab department   for your tests - we will call you with the results when they are available.       02/17/2020  f/u ov/[place] office/Shelley Richardson re:  Doe/ cough  And chest pain all predated covid by years now with leg swelling worse  Chief Complaint  Patient presents with  . Follow-up    Patient is still having some chest tightness, hurts to breath and feels like she is swollen. Shortness of breath with exertion. Covid in December. Cough is better with the tessalon perles.  Dyspnea: x 7 years / Cough: better less need for tessalon Sleeping: flat surface / one pillow / bed blocks not being used wakes up sev times a week with cough/ wheeze, sob and helps to sit up  SABA use: none  02: none  Cp midline x couple of years sometimes all day long = chest tightness worse since covid but present before covid seen in ER [facility] ? Better p prednisone > records req rec Aldactazide  25-25 take   1 or 2 each am and avoid salt and salty food  I will check your records from [facility] for your chest pain prior to covid 2019-2020 Please schedule a follow up office visit in 6-8  Weeks with PFTs on return  >   Not able    04/13/2020  f/u ov/[place] office/Shelley Richardson re:  Cough/ sob worse since covid  Chief Complaint  Patient presents with  . Follow-up    sore throat, productive cough at times with green phlegm for past month  Dyspnea:  No regular walking  Cough: worse since yelled at football game Sleeping: no bed blocks  SABA DUK:GURK 02 none   No obvious day to day or daytime variability or assoc excess/ purulent sputum or mucus plugs or hemoptysis or cp or chest tightness, subjective wheeze or overt sinus or hb symptoms.    . Also denies any obvious fluctuation of symptoms with weather or environmental changes or other aggravating or alleviating factors except as outlined above   No unusual exposure hx or h/o childhood pna/ asthma or knowledge of premature birth.  Current Allergies, Complete Past Medical History, Past Surgical History, Family History, and Social History were reviewed in [employer] record.  ROS  The following are not active complaints unless bolded Hoarseness, sore throat, dysphagia, dental problems, itching, sneezing,  nasal congestion or discharge of excess mucus or purulent secretions, ear ache,   fever, chills, sweats, unintended wt loss or wt gain, classically pleuritic or exertional cp,  orthopnea pnd or arm/hand swelling  or leg swelling, presyncope, palpitations, abdominal pain, anorexia, nausea, vomiting, diarrhea  or change in bowel habits or change in bladder habits, change in stools or change in urine, dysuria, hematuria,  rash, arthralgias, visual complaints, headache, numbness, weakness or ataxia or problems with walking or coordination,  change in mood or  memory.        Current Meds  Medication Sig  . Ascorbic Acid  (VITAMIN C) 100 MG tablet Take 100 mg by mouth daily.  . benzonatate (TESSALON) 200 MG capsule Take 1 capsule by mouth three times daily as needed for cough  . cholecalciferol (VITAMIN D3) 25 MCG (1000 UNIT) tablet Take 1,000 Units by mouth daily.  . famotidine (PEPCID) 20 MG tablet One after bfast and supper  . spironolactone-hydrochlorothiazide (ALDACTAZIDE) 25-25 MG tablet 1-2 daily  . traZODone (  DESYREL) 50 MG tablet Take 50 mg by mouth at bedtime.  Marland Kitchen zinc gluconate 50 MG tablet Take 50 mg by mouth daily.                   Objective:     Obese amb wf freq throat clearing but cough sounds very dry    04/13/2020      336  02/17/2020        353   01/06/2020       356   11/18/19 (!) 357 lb 4.8 oz (162.1 kg)  10/10/19 (!) 360 lb (163.3 kg)       Vital signs reviewed  04/13/2020  - Note at rest 02 sats  97% on RA       HEENT : pt wearing mask not removed for exam due to covid -19 concerns.    NECK :  without JVD/Nodes/TM/ nl carotid upstrokes bilaterally   LUNGS: no acc muscle use,  Nl contour chest which is clear to A and P bilaterally without cough on insp or exp maneuvers   CV:  RRR  no s3 or murmur or increase in P2, and pitting edema both ankles, mild  ABD: quit obese / soft and nontender with nl inspiratory excursion in the supine position. No bruits or organomegaly appreciated, bowel sounds nl  MS:  Nl gait/ ext warm without deformities, calf tenderness, cyanosis or clubbing No obvious joint restrictions   SKIN: warm and dry without lesions    NEURO:  alert, approp, nl sensorium with  no motor or cerebellar deficits apparent.         Assessment

## 2020-04-13 NOTE — Assessment & Plan Note (Signed)
Typically assoc with "seasonal cough"  Much worse since covid 19 xmas 2020 - 10/10/2019   Walked RA x 3 laps =  approx 531ft @ nl pace - stopped due to end of study s sob with sats of 95  % at the end of the study. -  11/18/2019   Walked RA 3 laps @ approx 250ft each @ slow pace  stopped due to end of study, not limited by sob and sats 97% at end   -  02/17/2020   [facility] RA  approx  300  ft  @ moderate pace  stopped due to end of study, min sob/tight with sats 99%   -  04/13/2020   Walked RA  approx   550 ft  @ fast pace  stopped due to  End of study, no sob and sats 97% at end     Despite MO I am unable to reproduce this symptom in office or demonstrate any pulmoary problem that would cause it so attributing to deconditioning > rec reconditioning

## 2020-04-19 ENCOUNTER — Telehealth: Payer: Self-pay | Admitting: Internal Medicine

## 2020-04-24 NOTE — Telephone Encounter (Signed)
Left message for [facility].    Per Dr. Sherene Sires: ent eval next step for sustained globus sensation

## 2020-04-26 NOTE — Telephone Encounter (Signed)
Called and spoke to [place] with Dr. Avel Sensor office and informed her of the referral dx. She verbalized understanding and states she will call the pt to get her scheduled for an appt. Nothing further needed at this time. Will sign off.

## 2020-05-22 ENCOUNTER — Other Ambulatory Visit: Payer: Self-pay

## 2020-05-22 DIAGNOSIS — J45991 Cough variant asthma: Secondary | ICD-10-CM

## 2020-07-18 ENCOUNTER — Encounter: Payer: Self-pay | Admitting: Allergy & Immunology

## 2020-07-18 ENCOUNTER — Ambulatory Visit (INDEPENDENT_AMBULATORY_CARE_PROVIDER_SITE_OTHER): Payer: Medicaid Other | Admitting: Allergy & Immunology

## 2020-07-18 ENCOUNTER — Other Ambulatory Visit: Payer: Self-pay

## 2020-07-18 VITALS — BP 118/80 | HR 72 | Temp 97.2°F | Resp 18 | Ht 62.99 in | Wt 311.0 lb

## 2020-07-18 DIAGNOSIS — R21 Rash and other nonspecific skin eruption: Secondary | ICD-10-CM

## 2020-07-18 DIAGNOSIS — J310 Chronic rhinitis: Secondary | ICD-10-CM | POA: Diagnosis not present

## 2020-07-18 DIAGNOSIS — J45991 Cough variant asthma: Secondary | ICD-10-CM | POA: Diagnosis not present

## 2020-07-18 MED ORDER — FLOVENT HFA 110 MCG/ACT IN AERO: 2.0000 | INHALATION_SPRAY | Freq: Two times a day (BID) | RESPIRATORY_TRACT | 5 refills | Status: DC

## 2020-07-18 MED ORDER — PERMETHRIN 5 % EX CREA: TOPICAL_CREAM | CUTANEOUS | 0 refills | Status: DC

## 2020-07-18 MED ORDER — ALBUTEROL SULFATE HFA 108 (90 BASE) MCG/ACT IN AERS: 4.0000 | INHALATION_SPRAY | Freq: Four times a day (QID) | RESPIRATORY_TRACT | 2 refills | Status: DC | PRN

## 2020-07-18 NOTE — Patient Instructions (Addendum)
1. Cough variant asthma vs UACS  - Lung testing looked low today, but it did improve slightly with the albuterol treatment. - We are going to start a daily controller medication to see if this helps.  - Spacer sample and demonstration provided. - Daily controller medication(s): Flovent 2 puffs twice daily with spacer - Prior to physical activity: albuterol 2 puffs 10-15 minutes before physical activity. - Rescue medications: albuterol 4 puffs every 4-6 hours as needed - Asthma control goals:  * Full participation in all desired activities (may need albuterol before activity) * Albuterol use two time or less a week on average (not counting use with activity) * Cough interfering with sleep two time or less a month * Oral steroids no more than once a year * No hospitalizations  2. Chronic rhinitis - Testing today showed: indoor molds, outdoor molds and dust mites - Copy of test results provided.  - Avoidance measures provided. - Start taking: Zyrtec (cetirizine) 10mg  tablet once daily and Flonase (fluticasone) one spray per nostril daily - You can use an extra dose of the antihistamine, if needed, for breakthrough symptoms.  - Consider nasal saline rinses 1-2 times daily to remove allergens from the nasal cavities as well as help with mucous clearance (this is especially helpful to do before the nasal sprays are given)  3. Rash - Since other people in the family have this rash, we are going to treat for scabies just in case that is what is going on. - Apply permethrin at night and leave on overnight, then shower in the morning to remove the excess.  - The rash does not look entirely consistent with scabies but we are going to make sure just in case.  - We are also sending in a tube of triamcinolone to use instead of that tiny tube.  - Apply twice daily to the rash until we see you again (use the permethrin cream FIRST, however).  4. Return in about 4 weeks (around 08/15/2020).    Please inform 08/17/2020 of any Emergency Department visits, hospitalizations, or changes in symptoms. Call us before going to the ED for breathing or allergy symptoms since we might be able to fit you in for a sick visit. Feel free to contact us anytime with any questions, problems, or concerns.  It was a pleasure to meet you today!  Websites that have reliable patient information: 1. American Academy of Asthma, Allergy, and Immunology: www.aaaai.org 2. Food Allergy Research and Education (FARE): foodallergy.org 3. Mothers of Asthmatics: http://www.asthmacommunitynetwork.org 4. American College of Allergy, Asthma, and Immunology: www.acaai.org   COVID-19 Vaccine Information can be found at: [place] For questions related to vaccine distribution or appointments, please email [email or web address] or call [phone].     "Like" [phone] on Facebook and Instagram for our latest updates!       Make sure you are registered to vote! If you have moved or changed any of your contact information, you will need to get this updated before voting!  In some cases, you MAY be able to register to vote online: [place]    Control of Mold Allergen   Mold and fungi can grow on a variety of surfaces provided certain temperature and moisture conditions exist.  Outdoor molds grow on plants, decaying vegetation and soil.  The major outdoor mold, Alternaria and Cladosporium, are found in very high numbers during hot and dry conditions.  Generally, a late Summer - Fall peak is seen for common outdoor fungal spores.  Rain  will temporarily lower outdoor mold spore count, but counts rise rapidly when the rainy period ends.  The most important indoor molds are Aspergillus and Penicillium.  Dark, humid and poorly ventilated basements are ideal sites for mold growth.  The next most common sites of mold growth are the  bathroom and the kitchen.  Outdoor (Seasonal) Mold Control  Positive outdoor molds via skin testing: Alternaria and Cladosporium  1. Use air conditioning and keep windows closed 2. Avoid exposure to decaying vegetation. 3. Avoid leaf raking. 4. Avoid grain handling. 5. Consider wearing a face mask if working in moldy areas.  6.   Indoor (Perennial) Mold Control   Positive indoor molds via skin testing: Fusarium, Aureobasidium (Pullulara) and Rhizopus  1. Maintain humidity below 50%. 2. Clean washable surfaces with 5% bleach solution. 3. Remove sources e.g. contaminated carpets.     Control of Dust Mite Allergen    Dust mites play a major role in allergic asthma and rhinitis.  They occur in environments with high humidity wherever human skin is found.  Dust mites absorb humidity from the atmosphere (ie, they do not drink) and feed on organic matter (including shed human and animal skin).  Dust mites are a microscopic type of insect that you cannot see with the naked eye.  High levels of dust mites have been detected from mattresses, pillows, carpets, upholstered furniture, bed covers, clothes, soft toys and any woven material.  The principal allergen of the dust mite is found in its feces.  A gram of dust may contain 1,000 mites and 250,000 fecal particles.  Mite antigen is easily measured in the air during house cleaning activities.  Dust mites do not bite and do not cause harm to humans, other than by triggering allergies/asthma.    Ways to decrease your exposure to dust mites in your home:  1. Encase mattresses, box springs and pillows with a mite-impermeable barrier or cover   2. Wash sheets, blankets and drapes weekly in hot water (130 F) with detergent and dry them in a dryer on the hot setting.  3. Have the room cleaned frequently with a vacuum cleaner and a damp dust-mop.  For carpeting or rugs, vacuuming with a vacuum cleaner equipped with a high-efficiency particulate air  (HEPA) filter.  The dust mite allergic individual should not be in a room which is being cleaned and should wait 1 hour after cleaning before going into the room. 4. Do not sleep on upholstered furniture (eg, couches).   5. If possible removing carpeting, upholstered furniture and drapery from the home is ideal.  Horizontal blinds should be eliminated in the rooms where the person spends the most time (bedroom, study, television room).  Washable vinyl, roller-type shades are optimal. 6. Remove all non-washable stuffed toys from the bedroom.  Wash stuffed toys weekly like sheets and blankets above.   7. Reduce indoor humidity to less than 50%.  Inexpensive humidity monitors can be purchased at most hardware stores.  Do not use a humidifier as can make the problem worse and are not recommended.

## 2020-07-18 NOTE — Progress Notes (Addendum)
NEW PATIENT  Date of Service/Encounter:  07/18/20  Referring provider: Selinda Flavin, MD   Assessment:   Cough variant asthma vs UACS   COVID long-hauler  Perennial and seasonal allergic rhinitis (indoor molds, outdoor molds and dust mites)  Rash - ? scabies with other family members with a similar rash  Plan/Recommendations:   1. Cough variant asthma vs UACS  - Lung testing looked low today, but it did improve slightly with the albuterol treatment. - We are going to start a daily controller medication to see if this helps.  - Spacer sample and demonstration provided. - Daily controller medication(s): Flovent 2 puffs twice daily with spacer - Prior to physical activity: albuterol 2 puffs 10-15 minutes before physical activity. - Rescue medications: albuterol 4 puffs every 4-6 hours as needed - Asthma control goals:  * Full participation in all desired activities (may need albuterol before activity) * Albuterol use two time or less a week on average (not counting use with activity) * Cough interfering with sleep two time or less a month * Oral steroids no more than once a year * No hospitalizations  2. Chronic rhinitis - Testing today showed: indoor molds, outdoor molds and dust mites - Copy of test results provided.  - Avoidance measures provided. - Start taking: Zyrtec (cetirizine) 10mg  tablet once daily and Flonase (fluticasone) one spray per nostril daily - You can use an extra dose of the antihistamine, if needed, for breakthrough symptoms.  - Consider nasal saline rinses 1-2 times daily to remove allergens from the nasal cavities as well as help with mucous clearance (this is especially helpful to do before the nasal sprays are given)  3. Rash - Since other people in the family have this rash, we are going to treat for scabies just in case that is what is going on. - Apply permethrin at night and leave on overnight, then shower in the morning to remove the  excess.  - The rash does not look entirely consistent with scabies but we are going to make sure just in case.  - We are also sending in a tube of triamcinolone to use instead of that tiny tube.  - Apply twice daily to the rash until we see you again (use the permethrin cream FIRST, however).  4. Return in about 4 weeks (around 08/15/2020).   Subjective:   Shelley Richardson is a 40 y.o. female presenting today for evaluation of  Chief Complaint  Patient presents with  . Allergy Testing    Shelley Richardson has a history of the following: Patient Active Problem List   Diagnosis Date Noted  . Peripheral edema 02/17/2020  . DOE (dyspnea on exertion) 10/10/2019  . Cough variant asthma vs UACS  10/10/2019  . Morbid obesity due to excess calories (HCC) 10/10/2019    History obtained from: chart review and patient.  Shelley Richardson was referred by Feliberto Gottron, MD.     Shelley Richardson is a 40 y.o. female presenting for an evaluation of a rash.  She also has shortness of breath, which has been followed by Dr. Work.   Asthma/Respiratory Symptom History: She does not have a diagnosis of asthma.  She has been followed by Dr. 24.  She has albuterol to use as needed, although unclear how often she is using it.  She first saw Dr. Sherene Sires after she had COVID-19 in December 2020.  Since having COVID-19, she has had persistent shortness of breath.  Even prior to  that, she did have shortness of breath but it was not as severe.  When she had COVID-19, she was hospitalized for 8 days and was not able to work at all for 2 or 3 months.  She is still on limited working hours.  She is on famotidine daily.     Allergic Rhinitis Symptom History: She does have occasional itching sneezing with watery eyes.  She will have a runny nose as well.  There are no particularly exacerbating environments, although she does show [place] pictures of her air filter in her apartment which is covered with dust.  She  has never been allergy tested.  She denies any worsening symptoms with exposure to animals.  Skin Symptom History: She does have a rash which is being ongoing for 6 weeks.  It is located on her abdomen all of her flank and extending to her back.  She has some areas on her arms, although these do look different places on her abdomen.  It is quite pruritic.  She has had triamcinolone which has been some evening to help it.  She does have a daughter who has a similar rash for the same duration of time.  They have never been diagnosed scabies.  She has never had a rash like this in the past.  Otherwise, there is no history of other atopic diseases, including food allergies, drug allergies, stinging insect allergies, eczema, urticaria or contact dermatitis. There is no significant infectious history. Vaccinations are up to date.    Past Medical History: Patient Active Problem List   Diagnosis Date Noted  . Peripheral edema 02/17/2020  . DOE (dyspnea on exertion) 10/10/2019  . Cough variant asthma vs UACS  10/10/2019  . Morbid obesity due to excess calories (HCC) 10/10/2019    Medication List:  Allergies as of 07/18/2020   No Known Allergies     Medication List       Accurate as of July 18, 2020  1:05 PM. If you have any questions, ask your nurse or doctor.        cholecalciferol 25 MCG (1000 UNIT) tablet Commonly known as: VITAMIN D3 Take 1,000 Units by mouth daily.   famotidine 20 MG tablet Commonly known as: Pepcid One after  supper   gabapentin 100 MG capsule Commonly known as: Neurontin Take 1 capsule (100 mg total) by mouth 3 (three) times daily. One three times daily   pantoprazole 40 MG tablet Commonly known as: Protonix Take 1 tablet (40 mg total) by mouth daily. Take 30-60 min before first meal of the day   permethrin 5 % cream Commonly known as: ELIMITE Apply from head to toe and leave on overnight. Then wash off in the shower. Repeat in one week if  needed. Started by: Alfonse Spruce, MD   spironolactone-hydrochlorothiazide 25-25 MG tablet Commonly known as: Aldactazide 1-2 daily   traZODone 50 MG tablet Commonly known as: DESYREL Take 50 mg by mouth at bedtime.   triamcinolone 0.1 % Commonly known as: KENALOG Apply topically.   vitamin C 100 MG tablet Take 100 mg by mouth daily.   zinc gluconate 50 MG tablet Take 50 mg by mouth daily.       Birth History: non-contributory  Developmental History: non-contributory  Past Surgical History: History reviewed. No pertinent surgical history.   Family History: Family History  Problem Relation Age of Onset  . Hypertension Mother   . Heart attack Maternal Grandfather   . Stomach cancer Paternal Grandfather  Social History: Shelley EvansCatherine lives at home with her two children and her boyfriend. They live in an apartment that is 656 years old. There is carpeting throughout the home. There is electric heating and central cooling. There are no animals inside or outside of the home. There are no dust mite coverings on the bedding. There is tobacco exposure inside of the home, but not in the car. She is currently working part time at [employer] for the past 19 years.    Review of Systems  Constitutional: Negative.  Negative for chills, fever, malaise/fatigue and weight loss.  HENT: Negative.  Negative for congestion, ear discharge, ear pain, sinus pain and sore throat.   Eyes: Negative for pain, discharge and redness.  Respiratory: Negative for cough, sputum production, shortness of breath and wheezing.   Cardiovascular: Negative.  Negative for chest pain and palpitations.  Gastrointestinal: Negative for abdominal pain, constipation, diarrhea, heartburn, nausea and vomiting.  Skin: Positive for itching and rash.  Neurological: Negative for dizziness and headaches.  Endo/Heme/Allergies: Negative for environmental allergies. Does not bruise/bleed easily.       Objective:    Blood pressure 118/80, pulse 72, temperature (!) 97.2 F (36.2 C), temperature source Temporal, resp. rate 18, height 5' 2.99" (1.6 m), weight (!) 311 lb (141.1 kg), SpO2 99 %. Body mass index is 55.11 kg/m.   Physical Exam:   Physical Exam Constitutional:      Appearance: She is well-developed. She is obese.  HENT:     Head: Normocephalic and atraumatic.     Right Ear: Tympanic membrane, ear canal and external ear normal. No drainage, swelling or tenderness. Tympanic membrane is not injected, scarred, erythematous, retracted or bulging.     Left Ear: Tympanic membrane, ear canal and external ear normal. No drainage, swelling or tenderness. Tympanic membrane is not injected, scarred, erythematous, retracted or bulging.     Ears:     Comments: No cerumen impaction noted.     Nose: No nasal deformity, septal deviation, mucosal edema, rhinorrhea or epistaxis.     Right Turbinates: Enlarged and swollen.     Left Turbinates: Enlarged and swollen.     Right Sinus: No maxillary sinus tenderness or frontal sinus tenderness.     Left Sinus: No maxillary sinus tenderness or frontal sinus tenderness.     Mouth/Throat:     Mouth: Oropharynx is clear and moist. Mucous membranes are not pale and not dry.     Pharynx: Uvula midline.  Eyes:     General:        Right eye: No discharge.        Left eye: No discharge.     Extraocular Movements: EOM normal.     Conjunctiva/sclera: Conjunctivae normal.     Right eye: Right conjunctiva is not injected. No chemosis.    Left eye: Left conjunctiva is not injected. No chemosis.    Pupils: Pupils are equal, round, and reactive to light.  Cardiovascular:     Rate and Rhythm: Normal rate and regular rhythm.     Heart sounds: Normal heart sounds.  Pulmonary:     Effort: Pulmonary effort is normal. No tachypnea, accessory muscle usage or respiratory distress.     Breath sounds: Normal breath sounds. No wheezing, rhonchi or rales.     Comments: Moving  air well in all lung fields. No crackles. Decreased air movement at the bases.  Chest:     Chest wall: No tenderness.  Abdominal:     Tenderness: There  is no abdominal tenderness. There is no guarding or rebound.  Lymphadenopathy:     Head:     Right side of head: No submandibular, tonsillar or occipital adenopathy.     Left side of head: No submandibular, tonsillar or occipital adenopathy.     Cervical: No cervical adenopathy.  Skin:    General: Skin is warm.     Capillary Refill: Capillary refill takes less than 2 seconds.     Coloration: Skin is not pale.     Findings: Rash present. No abrasion, erythema or petechiae. Rash is papular. Rash is not urticarial or vesicular.     Comments: Pinpoint macules on her abdomen with excoriation marks present. No honey crusting noted at all. She has more linear appearing rashes on her upper extremities.   Neurological:     Mental Status: She is alert.  Psychiatric:        Mood and Affect: Mood and affect normal.         Diagnostic studies:      Allergy Studies:     Airborne Adult Perc - 07/18/20 1002    Time Antigen Placed 1000    Allergen Manufacturer Greer    Location Back    Number of Test 59    Panel 1 Select    1. Control-Buffer 50% Glycerol Negative    2. Control-Histamine 1 mg/ml 2+    3. Albumin saline Negative    4. Bahia Negative    5. French Southern Territories Negative    6. Johnson Negative    7. Kentucky Blue Negative    8. Meadow Fescue Negative    9. Perennial Rye Negative    10. Sweet Vernal Negative    11. Timothy Negative    12. Cocklebur Negative    13. Burweed Marshelder Negative    14. Ragweed, short Negative    15. Ragweed, Giant Negative    16. Plantain,  English Negative    17. Lamb's Quarters Negative    18. Sheep Sorrell Negative    19. Rough Pigweed Negative    20. Marsh Elder, Rough Negative    21. Mugwort, Common Negative    22. Ash mix Negative    23. Birch mix Negative    24. Beech American Negative     25. Box, Elder Negative    26. Cedar, red Negative    27. Cottonwood, Guinea-Bissau Negative    28. Elm mix Negative    29. Hickory Negative    30. Maple mix Negative    31. Oak, Guinea-Bissau mix Negative    32. Pecan Pollen Negative    33. Pine mix Negative    34. Sycamore Eastern Negative    35. Walnut, Black Pollen Negative    36. Alternaria alternata Negative    37. Cladosporium Herbarum Negative    38. Aspergillus mix Negative    39. Penicillium mix Negative    40. Bipolaris sorokiniana (Helminthosporium) Negative    41. Drechslera spicifera (Curvularia) Negative    42. Mucor plumbeus Negative    43. Fusarium moniliforme Negative    44. Aureobasidium pullulans (pullulara) Negative    45. Rhizopus oryzae Negative    46. Botrytis cinera Negative    47. Epicoccum nigrum Negative    48. Phoma betae Negative    49. Candida Albicans Negative    50. Trichophyton mentagrophytes Negative    51. Mite, D Farinae  5,000 AU/ml Negative    52. Mite, D Pteronyssinus  5,000 AU/ml Negative  53. Cat Hair 10,000 BAU/ml Negative    54.  Dog Epithelia Negative    55. Mixed Feathers Negative    56. Horse Epithelia Negative    57. Cockroach, German Negative    58. Mouse Negative    59. Tobacco Leaf Negative          Intradermal - 07/18/20 1006    Time Antigen Placed 0945    Allergen Manufacturer Waynette Buttery    Location Arm    Number of Test 11    Intradermal Select    Control Negative    French Southern Territories Negative    Johnson Negative    7 Grass Negative    Ragweed mix Negative    Weed mix Negative    Tree mix Negative    Mold 1 2+    Mold 2 Negative    Mold 3 Negative    Mold 4 3+    Cat Negative    Dog Negative    Cockroach Negative    Mite mix 3+    Other Omitted           Allergy testing results were read and interpreted by myself, documented by clinical staff.         Malachi Bonds, MD [facility]

## 2020-07-21 ENCOUNTER — Other Ambulatory Visit: Payer: Self-pay | Admitting: Internal Medicine

## 2020-07-24 ENCOUNTER — Other Ambulatory Visit: Payer: Self-pay | Admitting: Internal Medicine

## 2020-08-15 ENCOUNTER — Encounter: Payer: Self-pay | Admitting: Allergy & Immunology

## 2020-08-15 ENCOUNTER — Other Ambulatory Visit: Payer: Self-pay

## 2020-08-15 ENCOUNTER — Ambulatory Visit (INDEPENDENT_AMBULATORY_CARE_PROVIDER_SITE_OTHER): Payer: Medicaid Other | Admitting: Allergy & Immunology

## 2020-08-15 VITALS — BP 140/88 | HR 61 | Ht 63.0 in

## 2020-08-15 DIAGNOSIS — J310 Chronic rhinitis: Secondary | ICD-10-CM | POA: Diagnosis not present

## 2020-08-15 DIAGNOSIS — J45991 Cough variant asthma: Secondary | ICD-10-CM

## 2020-08-15 DIAGNOSIS — U099 Post covid-19 condition, unspecified: Secondary | ICD-10-CM

## 2020-08-15 MED ORDER — CETIRIZINE HCL 10 MG PO TABS: 10.0000 mg | ORAL_TABLET | Freq: Every day | ORAL | 5 refills | Status: DC

## 2020-08-15 MED ORDER — FLUTICASONE PROPIONATE 50 MCG/ACT NA SUSP: 1.0000 | Freq: Every day | NASAL | 5 refills | Status: DC

## 2020-08-15 NOTE — Patient Instructions (Addendum)
1. Cough variant asthma vs UACS  - Lung testing looked a little bit better today - NICE WORK!  - We are going to continue with the same set of medications for now.  - Try to use them consistently so we can see how well you are doing.  - Daily controller medication(s): Flovent 2 puffs twice daily with spacer - Prior to physical activity: albuterol 2 puffs 10-15 minutes before physical activity. - Rescue medications: albuterol 4 puffs every 4-6 hours as needed - Asthma control goals:  * Full participation in all desired activities (may need albuterol before activity) * Albuterol use two time or less a week on average (not counting use with activity) * Cough interfering with sleep two time or less a month * Oral steroids no more than once a year * No hospitalizations  2. Chronic rhinitis (indoor molds, outdoor molds and dust mites) - I sent in your medications (sorry that was not done last time).  - Continue taking: Zyrtec (cetirizine) 10mg  tablet once daily and Flonase (fluticasone) one spray per nostril daily - You can use an extra dose of the antihistamine, if needed, for breakthrough symptoms.  - Consider nasal saline rinses 1-2 times daily to remove allergens from the nasal cavities as well as help with mucous clearance (this is especially helpful to do before the nasal sprays are given)  3. Return in about 3 months (around 11/12/2020).    Please inform 11/14/2020 of any Emergency Department visits, hospitalizations, or changes in symptoms. Call us before going to the ED for breathing or allergy symptoms since we might be able to fit you in for a sick visit. Feel free to contact us anytime with any questions, problems, or concerns.  It was a pleasure to see you again today!  Websites that have reliable patient information: 1. American Academy of Asthma, Allergy, and Immunology: www.aaaai.org 2. Food Allergy Research and Education (FARE): foodallergy.org 3. Mothers of Asthmatics:  http://www.asthmacommunitynetwork.org 4. American College of Allergy, Asthma, and Immunology: www.acaai.org   COVID-19 Vaccine Information can be found at: [place] For questions related to vaccine distribution or appointments, please email [email or web address] or call [phone].   We realize that you might be concerned about having an allergic reaction to the COVID19 vaccines. To help with that concern, WE ARE OFFERING THE COVID19 VACCINES IN OUR OFFICE! Ask the front desk for dates!     "Like" [phone] on Facebook and Instagram for our latest updates!      A healthy democracy works best when [place] participate! Make sure you are registered to vote! If you have moved or changed any of your contact information, you will need to get this updated before voting!  In some cases, you MAY be able to register to vote online: [employer]

## 2020-08-15 NOTE — Progress Notes (Signed)
FOLLOW UP  Date of Service/Encounter:  08/15/20   Assessment:   Cough variant asthma vs UACS    COVID long-hauler  Perennial and seasonal allergic rhinitis (indoor molds, outdoor molds and dust mites)  Rash - ? scabies with other family members with a similar rash  Plan/Recommendations:   1. Cough variant asthma vs UACS  - Lung testing looked a little bit better today - NICE WORK!  - We are going to continue with the same set of medications for now.  - Try to use them consistently so we can see how well you are doing.  - Daily controller medication(s): Flovent 2 puffs twice daily with spacer - Prior to physical activity: albuterol 2 puffs 10-15 minutes before physical activity. - Rescue medications: albuterol 4 puffs every 4-6 hours as needed - Asthma control goals:  * Full participation in all desired activities (may need albuterol before activity) * Albuterol use two time or less a week on average (not counting use with activity) * Cough interfering with sleep two time or less a month * Oral steroids no more than once a year * No hospitalizations  2. Chronic rhinitis (indoor molds, outdoor molds and dust mites) - I sent in your medications (sorry that was not done last time).  - Continue taking: Zyrtec (cetirizine) 10mg  tablet once daily and Flonase (fluticasone) one spray per nostril daily - You can use an extra dose of the antihistamine, if needed, for breakthrough symptoms.  - Consider nasal saline rinses 1-2 times daily to remove allergens from the nasal cavities as well as help with mucous clearance (this is especially helpful to do before the nasal sprays are given)  3. Return in about 3 months (around 11/12/2020).   Subjective:   Shelley Richardson is a 40 y.o. female presenting today for follow up of No chief complaint on file.   Shelley Richardson has a history of the following: Patient Active Problem List   Diagnosis Date Noted  .  Peripheral edema 02/17/2020  . DOE (dyspnea on exertion) 10/10/2019  . Cough variant asthma vs UACS  10/10/2019  . Morbid obesity due to excess calories (HCC) 10/10/2019    History obtained from: chart review and patient.  [name] is a 40 y.o. female presenting for a follow up visit. She was last seen in January 2022. At that time, her lung testing looked terrible, but it did have some reversibility. We added on Flovent February 2022 two puffs BID as well as albuterol as needed. For her rhinitis, she had testing that was positive to molds as well as dust mites. We started her on cetirizine daily as well as Flonase. She had a rash concerning for scabies. We treated the entire family just to be on the safe side.   Since the last visit, she has done well. Her rash has actually improved. It is not fully healed but it is certainly better than when we first saw her. The  [place] are slowly healing.   Asthma/Respiratory Symptom History: She does forget the Flovent a couple of times per week. She thinks thate it has helped with the coughing. She remains on the coughing pill, although she does not think that this is Tessalon pearls. She is on the gabapentin which she takes for coughing. This was prescribed by Dr. . She thinks that this helped the most. She does miss some of these doses as well. If she comes off of it, she will be coughing a lot. They  have not called her for a follow up with Dr. Sherene Sires.   Allergic Rhinitis Symptom History: She seems confused about Flonase. She has been using her kiddo's cetirizine which does provide some relief of her symptoms. She has not needed antibitoics at all.  She has been on pulmonologist was was to send in the Flonase, but when it was not at the pharmacy she assumed that the pulmonologist had changed his mind. She never called to follow up.   Otherwise, there have been no changes to her past medical history, surgical history, family history, or social  history.    Review of Systems  Constitutional: Negative.  Negative for chills, fever, malaise/fatigue and weight loss.  HENT: Positive for congestion. Negative for ear discharge, ear pain and sinus pain.   Eyes: Negative for pain, discharge and redness.  Respiratory: Negative for cough, sputum production, shortness of breath and wheezing.   Cardiovascular: Negative.  Negative for chest pain and palpitations.  Gastrointestinal: Negative for abdominal pain, constipation, diarrhea, heartburn, nausea and vomiting.  Skin: Negative.  Negative for itching and rash.  Neurological: Negative for dizziness and headaches.  Endo/Heme/Allergies: Negative for environmental allergies. Does not bruise/bleed easily.       Objective:   There were no vitals taken for this visit. There is no height or weight on file to calculate BMI.   Physical Exam:  Physical Exam Constitutional:      Appearance: She is well-developed.  HENT:     Head: Normocephalic and atraumatic.     Right Ear: Tympanic membrane, ear canal and external ear normal.     Left Ear: Tympanic membrane, ear canal and external ear normal.     Nose: No nasal deformity, septal deviation, mucosal edema, rhinorrhea or epistaxis.     Right Turbinates: Enlarged and swollen.     Left Turbinates: Enlarged and swollen.     Right Sinus: No maxillary sinus tenderness or frontal sinus tenderness.     Left Sinus: No maxillary sinus tenderness or frontal sinus tenderness.     Mouth/Throat:     Mouth: Oropharynx is clear and moist. Mucous membranes are not pale and not dry.     Pharynx: Uvula midline.  Eyes:     General:        Right eye: No discharge.        Left eye: No discharge.     Extraocular Movements: EOM normal.     Conjunctiva/sclera: Conjunctivae normal.     Right eye: Right conjunctiva is not injected. No chemosis.    Left eye: Left conjunctiva is not injected. No chemosis.    Pupils: Pupils are equal, round, and reactive to  light.  Cardiovascular:     Rate and Rhythm: Normal rate and regular rhythm.     Heart sounds: Normal heart sounds.  Pulmonary:     Effort: Pulmonary effort is normal. No tachypnea, accessory muscle usage or respiratory distress.     Breath sounds: Normal breath sounds. No wheezing, rhonchi or rales.     Comments: Somewhat decreased air movement at the bases.  Chest:     Chest wall: No tenderness.  Lymphadenopathy:     Cervical: No cervical adenopathy.  Skin:    General: Skin is warm.     Capillary Refill: Capillary refill takes less than 2 seconds.     Coloration: Skin is not pale.     Findings: No abrasion, erythema, petechiae or rash. Rash is not papular, urticarial or vesicular.  Comments: No eczematous or urticarial lesions noted.  She does have some healing sores on her abdomen.  Neurological:     Mental Status: She is alert.  Psychiatric:        Mood and Affect: Mood and affect normal.        Behavior: Behavior is cooperative.      Diagnostic studies:    Spirometry: results abnormal (FEV1: 1.58/56%, FVC: 1.93/56%, FEV1/FVC: 82%).    Spirometry consistent with possible restrictive disease.   Allergy Studies: none       Malachi Bonds, MD  [facility]

## 2020-09-15 ENCOUNTER — Other Ambulatory Visit: Payer: Self-pay | Admitting: Internal Medicine

## 2020-09-25 ENCOUNTER — Other Ambulatory Visit: Payer: Self-pay | Admitting: Internal Medicine

## 2020-09-25 DIAGNOSIS — J45991 Cough variant asthma: Secondary | ICD-10-CM

## 2020-11-12 NOTE — Patient Instructions (Addendum)
1. Cough variant asthma vs UACS  - Daily controller medication(s):Start Flovent 2 puffs twice daily with spacer. Make sure and use this every day.  - Prior to physical activity: albuterol 2 puffs 10-15 minutes before physical activity. - Rescue medications: albuterol 4 puffs every 4-6 hours as needed - Asthma control goals:  * Full participation in all desired activities (may need albuterol before activity) * Albuterol use two time or less a week on average (not counting use with activity) * Cough interfering with sleep two time or less a month * Oral steroids no more than once a year * No hospitalizations  2. Chronic rhinitis (indoor molds, outdoor molds and dust mites) - Continue taking: Zyrtec (cetirizine) 10mg  tablet once daily and Flonase (fluticasone) one spray per nostril daily - You can use an extra dose of the antihistamine, if needed, for breakthrough symptoms.  - Consider nasal saline rinses 1-2 times daily to remove allergens from the nasal cavities as well as help with mucous clearance (this is especially helpful to do before the nasal sprays are given)  3.  Schedule a follow-up appointment in 4 weeks or sooner if needed

## 2020-11-14 ENCOUNTER — Ambulatory Visit: Payer: Medicaid Other | Admitting: Family

## 2020-11-14 ENCOUNTER — Other Ambulatory Visit: Payer: Self-pay

## 2020-11-14 ENCOUNTER — Encounter: Payer: Self-pay | Admitting: Family

## 2020-11-14 VITALS — BP 130/80 | HR 68 | Temp 97.6°F | Resp 18 | Ht 63.0 in | Wt 294.8 lb

## 2020-11-14 DIAGNOSIS — J45991 Cough variant asthma: Secondary | ICD-10-CM | POA: Diagnosis not present

## 2020-11-14 DIAGNOSIS — J310 Chronic rhinitis: Secondary | ICD-10-CM | POA: Diagnosis not present

## 2020-11-14 MED ORDER — FLOVENT HFA 110 MCG/ACT IN AERO: 2.0000 | INHALATION_SPRAY | Freq: Two times a day (BID) | RESPIRATORY_TRACT | 5 refills | Status: DC

## 2020-11-14 MED ORDER — CETIRIZINE HCL 10 MG PO TABS: 10.0000 mg | ORAL_TABLET | Freq: Every day | ORAL | 5 refills | Status: DC

## 2020-11-14 MED ORDER — ALBUTEROL SULFATE HFA 108 (90 BASE) MCG/ACT IN AERS: INHALATION_SPRAY | RESPIRATORY_TRACT | 1 refills | Status: DC

## 2020-11-14 NOTE — Progress Notes (Signed)
[name] [address] Dept: [phone]  FOLLOW UP NOTE  Patient ID: Shelley Richardson, female    DOB: 05/06/1981  Age: 40 y.o. MRN: [id number] Date of Office Visit: 11/14/2020  Assessment  Chief Complaint: Asthma  HPI Shelley Richardson is a 40 year old female who presents today for follow-up of cough variant asthma versus upper airway cough syndrome and chronic rhinitis.  She was last seen on August 15, 2020 by Dr. Dellis Anes.  Since we last saw her she reports that she has been to the hospital for planter fasciitis/bone spur/heel spur in her left foot and was placed on meloxicam.  She also reports that she has had a tooth removed since we last saw her and is planning on having a root canal next month.  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  She reports that her cough is fine as long as she takes the gabapentin.  She will occasionally have a tickle in her throat.  She reports wheezing with exertion, shortness of breath with exertion and some tightness in her chest right now.  Since her last office visit she has not made any trips to the emergency room or urgent care due to breathing problems.  She has not used her albuterol this week and none last week but mentions that she probably should have used it last week before going to the gym.  She reports that she does not remember which inhaler is which when discussing inhalers.  Also, she reports that she did not know that she needed to use her Flovent daily.  She reports that she has so much going on with her life right now.  Chronic rhinitis is reported as moderately controlled with Flonase nasal spray as needed.  She is currently out of cetirizine 10 mg tablets.  She reports occasional clear rhinorrhea at times that can also be green at times.  She also reports occasional nasal congestion.  She  maybe has a little bit of postnasal drip.  She has not had any sinus infections since we last saw her.  Drug Allergies:  No Known Allergies  Review of Systems: Review of Systems  Constitutional: Negative for chills and fever.  HENT:       Reports occasional rhinorrhea that can be green at times, clear at times.  Also reports occasional nasal congestion.  She also reports maybe a little of postnasal drip.  Eyes:       Denies itchy watery eyes  Respiratory: Positive for shortness of breath and wheezing. Negative for cough.        Reports shortness of breath and wheezing with exertion.  Denies coughing as long as she takes gabapentin  Cardiovascular: Negative for chest pain and palpitations.  Gastrointestinal: Negative for heartburn.  Genitourinary: Negative for dysuria.  Skin: Positive for itching.       Reports itching due to eczema on abdomen   Neurological: Positive for headaches.       Reports occasional headaches  Endo/Heme/Allergies: Positive for environmental allergies.     Physical Exam: BP 130/80 (BP Location: Left Arm, Patient Position: Sitting, Cuff Size: Large)   Pulse 68   Temp 97.6 F (36.4 C) (Temporal)   Resp 18   Ht 5\' 3"  (1.6 m)   Wt 294 lb 12.8 oz (133.7 kg)   SpO2 95%   BMI 52.22 kg/m    Physical Exam  Constitutional:      Appearance: Normal appearance.  HENT:     Head: Normocephalic and atraumatic.     Comments: Pharynx normal, eyes normal, ears normal, nose normal    Right Ear: Tympanic membrane, ear canal and external ear normal.     Left Ear: Tympanic membrane, ear canal and external ear normal.     Nose: Nose normal.     Mouth/Throat:     Mouth: Mucous membranes are moist.     Pharynx: Oropharynx is clear.  Eyes:     Conjunctiva/sclera: Conjunctivae normal.  Cardiovascular:     Rate and Rhythm: Regular rhythm.     Heart sounds: Normal heart sounds.  Pulmonary:     Effort: Pulmonary effort is normal.     Breath sounds: Normal breath sounds.      Comments: Lungs clear to auscultation Musculoskeletal:     Cervical back: Neck supple.  Skin:    General: Skin is warm.     Comments: No rash, urticarial lesion, or eczematous lesion noted on abdomen  Neurological:     Mental Status: She is alert and oriented to person, place, and time.  Psychiatric:        Mood and Affect: Mood normal.        Behavior: Behavior normal.        Thought Content: Thought content normal.        Judgment: Judgment normal.     Diagnostics: FVC 2.36 L, FEV1 1.95 L.  Predicted FVC 3.56 L, predicted FEV1 2.93 L.  Spirometry indicates possible moderate restriction.  Assessment and Plan: 1. Cough variant asthma   2. Chronic rhinitis     No orders of the defined types were placed in this encounter.   Patient Instructions  1. Cough variant asthma vs UACS  - Daily controller medication(s):Start Flovent 2 puffs twice daily with spacer. Make sure and use this every day.  - Prior to physical activity: albuterol 2 puffs 10-15 minutes before physical activity. - Rescue medications: albuterol 4 puffs every 4-6 hours as needed - Asthma control goals:  * Full participation in all desired activities (may need albuterol before activity) * Albuterol use two time or less a week on average (not counting use with activity) * Cough interfering with sleep two time or less a month * Oral steroids no more than once a year * No hospitalizations  2. Chronic rhinitis (indoor molds, outdoor molds and dust mites) - Continue taking: Zyrtec (cetirizine) 10mg  tablet once daily and Flonase (fluticasone) one spray per nostril daily - You can use an extra dose of the antihistamine, if needed, for breakthrough symptoms.  - Consider nasal saline rinses 1-2 times daily to remove allergens from the nasal cavities as well as help with mucous clearance (this is especially helpful to do before the nasal sprays are given)  3.  Schedule a follow-up appointment in 4 weeks or  sooner if needed            Return in about 4 weeks (around 12/12/2020), or if symptoms worsen or fail to improve.    Thank you for the opportunity to care for this patient.  Please do not hesitate to contact me with questions.  12/14/2020, FNP [facility]

## 2020-12-18 NOTE — Patient Instructions (Addendum)
1. Cough variant asthma vs UACS  Stop Flovent 110 mcg - Daily controller medication(s): Start Symbicort 160/4.5 mcg using 2 puffs twice a day with spacer- called and spoke with the patient to update her on the change of medications. She verbalizes understanding - Prior to physical activity: albuterol 2 puffs 10-15 minutes before physical activity. - Rescue medications: albuterol 4 puffs every 4-6 hours as needed - Asthma control goals:  * Full participation in all desired activities (may need albuterol before activity) * Albuterol use two time or less a week on average (not counting use with activity) * Cough interfering with sleep two time or less a month * Oral steroids no more than once a year * No hospitalizations  2. Chronic rhinitis (indoor molds, outdoor molds and dust mites) - Continue taking: Zyrtec (cetirizine) 10mg  tablet once daily and Flonase (fluticasone) one spray per nostril daily - You can use an extra dose of the antihistamine, if needed, for breakthrough symptoms.  - Consider nasal saline rinses 1-2 times daily to remove allergens from the nasal cavities as well as help with mucous clearance (this is especially helpful to do before the nasal sprays are given)  contact your primary care physician about dizzy lightheaded sensation and ringing in the ears.  If these the symptoms occur again please proceed to the emergency room.  3.  Schedule a follow-up appointment in 3 months or sooner if needed

## 2020-12-19 ENCOUNTER — Ambulatory Visit: Payer: Medicaid Other | Admitting: Family

## 2020-12-19 ENCOUNTER — Other Ambulatory Visit: Payer: Self-pay

## 2020-12-19 ENCOUNTER — Encounter: Payer: Self-pay | Admitting: Family

## 2020-12-19 VITALS — BP 126/80 | HR 90 | Temp 98.2°F | Resp 16 | Ht 63.0 in | Wt 280.4 lb

## 2020-12-19 DIAGNOSIS — J45991 Cough variant asthma: Secondary | ICD-10-CM | POA: Diagnosis not present

## 2020-12-19 DIAGNOSIS — J310 Chronic rhinitis: Secondary | ICD-10-CM | POA: Diagnosis not present

## 2020-12-19 MED ORDER — BUDESONIDE-FORMOTEROL FUMARATE 160-4.5 MCG/ACT IN AERO: INHALATION_SPRAY | RESPIRATORY_TRACT | 3 refills | Status: DC

## 2020-12-19 NOTE — Progress Notes (Signed)
[address] [place] [address] Dept: [phone]  FOLLOW UP NOTE  Patient ID: Shelley Richardson, female    DOB: 1981-03-29  Age: 40 y.o. 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  She was last seen on Nov 14, 2020 by Nehemiah Settle, FNP.    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  She reports that she is been trying to use this more consistently.  She reports some wheezing at times, tightness in her chest at times, and some shortness of breath.  She denies any coughing as long as she takes the gabapentin.  She did note that she had a little tickle in her throat.  She currently reports that she is not using her albuterol that often, but there are times that she forgets to carry it with her and feels like she should use it.  She has not been on any systemic steroids since we last saw her.  She did go to the emergency room a couple weekends ago due to pain in her chest and it hurt when she took a breath.  She reports that while in the emergency room she was told that it was due to her abdomen. While in the ER she reports that she was given morphine, Pepcid, hydrocodone, and Zofran.  Chronic rhinitis is reported as moderately controlled with Zyrtec 10 mg once a day and Flonase nasal spray as needed.  She reports a little bit of rhinorrhea at times and denies nasal congestion and postnasal drip.  She has not had any sinus infections since we last saw her.  She mentions that last Wednesday while at work at [employer] she almost passed out 6 times due to  getting too hot and stress.  She reports feeling dizzy, lightheaded, chest hurting, and ringing in her ears.  She did not go on to the emergency room and has not discussed this with her primary care physician.  She reports that she has not seen her primary care physician in quite a while.  Gust the importance of if this symptoms should occur again that she proceed to the emergency room and that she needs to schedule an appointment with her primary care physician to discuss the symptoms.   Drug Allergies:  No Known Allergies  Review of Systems: Review of Systems  Constitutional:  Negative for chills and fever.  HENT:         Reports clear rhinorrhea at times and nasal congestion at times.  Denies postnasal drip.  Eyes:        Denies itchy watery eyes  Respiratory:         Reports some wheezing, shortness of breath and tightness in her chest.  She denies coughing as long as she takes gabapentin, but did mention that she did have a little tickle in her throat.    Cardiovascular:  Positive for chest pain.       Chest pain at times.  She reports that she went to the emergency room a couple weekends ago due to having chest pain and hurting when she was breathing.  She reports that ER told her her pain was more epigastric  Gastrointestinal:        Denies heartburn /reflux symptoms  Genitourinary:  Negative for dysuria.  Skin:  Positive for itching.       Reports itching on her left shoulder  Neurological:  Positive for dizziness. Negative for headaches.  Endo/Heme/Allergies:  Positive for environmental allergies.    Physical Exam: BP 126/80   Pulse 90   Temp 98.2 F (36.8 C) (Temporal)   Resp 16   Ht 5\' 3"  (1.6 m)   Wt 280 lb 6.4 oz (127.2 kg)   SpO2 97%   BMI 49.67 kg/m    Physical Exam Constitutional:      Appearance: Normal appearance.  HENT:     Head: Normocephalic and atraumatic.     Comments: Pharynx normal, eyes normal, ears normal, nose: Bilateral lower turbinates mildly  edematous and slightly erythematous with no drainage noted    Right Ear: Tympanic membrane, ear canal and external ear normal.     Left Ear: Tympanic membrane, ear canal and external ear normal.     Mouth/Throat:     Mouth: Mucous membranes are moist.     Pharynx: Oropharynx is clear.  Eyes:     Conjunctiva/sclera: Conjunctivae normal.  Cardiovascular:     Rate and Rhythm: Regular rhythm.     Heart sounds: Normal heart sounds.  Pulmonary:     Effort: Pulmonary effort is normal.     Breath sounds: Normal breath sounds.     Comments: Lungs clear to auscultation Musculoskeletal:     Cervical back: Neck supple.  Skin:    General: Skin is warm.  Neurological:     Mental Status: She is alert and oriented to person, place, and time.  Psychiatric:        Mood and Affect: Mood normal.        Behavior: Behavior normal.        Thought Content: Thought content normal.        Judgment: Judgment normal.    Diagnostics: 2.60 L, FEV1 2.29 L.  Predicted FVC 3.56 L, predicted FEV1 2.92 L.  Spirometry indicates possible mild restriction.  This is an improvement from previous spirometry  Assessment and Plan: 1. Cough variant asthma   2. Chronic rhinitis     Meds ordered this encounter  Medications   budesonide-formoterol (SYMBICORT) 160-4.5 MCG/ACT inhaler    Sig: Use 2 puffs twice a day with spacer to help prevent cough and wheeze    Dispense:  1 each    Refill:  3     Patient Instructions  1. Cough variant asthma vs UACS  Stop Flovent 110 mcg - Daily controller medication(s): Start Symbicort 160/4.5 mcg using 2 puffs twice a day with spacer - Prior to physical activity: albuterol 2 puffs 10-15 minutes before physical activity. - Rescue medications: albuterol 4 puffs every 4-6 hours as needed - Asthma control goals:  * Full participation in all desired activities (may need albuterol before activity) * Albuterol use two time or less a week on average (not counting use with  activity) * Cough interfering with sleep two time or less a month * Oral steroids no more than once a year * No hospitalizations  2. Chronic rhinitis (indoor molds, outdoor molds and dust mites) - Continue taking: Zyrtec (cetirizine) 10mg  tablet once daily and Flonase (fluticasone) one spray per nostril daily - You can use an extra dose of the antihistamine, if needed, for breakthrough  symptoms.  - Consider nasal saline rinses 1-2 times daily to remove allergens from the nasal cavities as well as help with mucous clearance (this is especially helpful to do before the nasal sprays are given)  Is contact your primary care physician about dizzy lightheaded sensation and ringing in the ears.  If these the symptoms occur again please proceed to the emergency room.  3.  Schedule a follow-up appointment in 3 months or sooner if needed          Return in about 3 months (around 03/21/2021), or if symptoms worsen or fail to improve.    Thank you for the opportunity to care for this patient.  Please do not hesitate to contact me with questions.  Nehemiah Settle, FNP [facility]

## 2020-12-19 NOTE — Addendum Note (Signed)
Addended by: Grier Rocher on: 12/19/2020 05:13 PM   Modules accepted: Orders

## 2020-12-26 ENCOUNTER — Other Ambulatory Visit: Payer: Self-pay | Admitting: Internal Medicine

## 2021-01-29 ENCOUNTER — Other Ambulatory Visit: Payer: Self-pay | Admitting: Internal Medicine

## 2021-03-22 ENCOUNTER — Ambulatory Visit: Payer: Medicaid Other | Admitting: Allergy & Immunology

## 2021-03-23 ENCOUNTER — Other Ambulatory Visit: Payer: Self-pay | Admitting: Internal Medicine

## 2021-03-25 ENCOUNTER — Other Ambulatory Visit: Payer: Self-pay

## 2021-03-25 ENCOUNTER — Encounter: Payer: Self-pay | Admitting: Family Medicine

## 2021-03-25 ENCOUNTER — Ambulatory Visit: Payer: Medicaid Other | Admitting: Family Medicine

## 2021-03-25 DIAGNOSIS — K219 Gastro-esophageal reflux disease without esophagitis: Secondary | ICD-10-CM | POA: Diagnosis not present

## 2021-03-25 DIAGNOSIS — J3089 Other allergic rhinitis: Secondary | ICD-10-CM

## 2021-03-25 DIAGNOSIS — J45991 Cough variant asthma: Secondary | ICD-10-CM

## 2021-03-25 DIAGNOSIS — H1010 Acute atopic conjunctivitis, unspecified eye: Secondary | ICD-10-CM | POA: Diagnosis not present

## 2021-03-25 DIAGNOSIS — J302 Other seasonal allergic rhinitis: Secondary | ICD-10-CM

## 2021-03-25 DIAGNOSIS — J310 Chronic rhinitis: Secondary | ICD-10-CM | POA: Insufficient documentation

## 2021-03-25 MED ORDER — CETIRIZINE HCL 10 MG PO TABS: 10.0000 mg | ORAL_TABLET | Freq: Every day | ORAL | 5 refills | Status: DC

## 2021-03-25 NOTE — Patient Instructions (Addendum)
Asthma Continue Symbicort 160-2 puffs twice a day to prevent cough or wheeze Continue albuterol 2 puffs once every 4 hours as needed for cough or wheeze You may use albuterol 2 puffs 5 to 15 minutes before activity to decrease cough or wheeze  Allergic rhinitis Continue allergen avoidance measures directed toward mold and dust mite as listed below Continue cetirizine 10 mg once a day as needed for runny nose or itch Continue Flonase 2 sprays in each nostril once a day as needed for stuffy nose Consider saline nasal rinses as needed for nasal symptoms. Use this before any medicated nasal sprays for best result  Allergic conjunctivitis Some over the counter eye drops include Pataday one drop in each eye once a day as needed for red, itchy eyes OR Zaditor one drop in each eye twice a day as needed for red itchy eyes.  Reflux Continue dietary and lifestyle modifications as listed below Continue pantoprazole 40 mg once a day to control reflux Began famotidine 20 mg once a day to control reflux  Call the clinic if this treatment plan is not working well for you.  Follow up in the clinic in 4 months or sooner if needed.   Lifestyle Changes for Controlling GERD When you have GERD, stomach acid feels as if it's backing up toward your mouth. Whether or not you take medication to control your GERD, your symptoms can often be improved with lifestyle changes.   Raise Your Head Reflux is more likely to strike when you're lying down flat, because stomach fluid can flow backward more easily. Raising the head of your bed 4-6 inches can help. To do this: Slide blocks or books under the legs at the head of your bed. Or, place a wedge under the mattress. Many foam stores can make a suitable wedge for you. The wedge should run from your waist to the top of your head. Don't just prop your head on several pillows. This increases pressure on your stomach. It can make GERD worse.  Watch Your Eating  Habits Certain foods may increase the acid in your stomach or relax the lower esophageal sphincter, making GERD more likely. It's best to avoid the following: Coffee, tea, and carbonated drinks (with and without caffeine) Fatty, fried, or spicy food Mint, chocolate, onions, and tomatoes Any other foods that seem to irritate your stomach or cause you pain  Relieve the Pressure Eat smaller meals, even if you have to eat more often. Don't lie down right after you eat. Wait a few hours for your stomach to empty. Avoid tight belts and tight-fitting clothes. Lose excess weight.  Tobacco and Alcohol Avoid smoking tobacco and drinking alcohol. They can make GERD symptoms worse.  Control of Mold Allergen Mold and fungi can grow on a variety of surfaces provided certain temperature and moisture conditions exist.  Outdoor molds grow on plants, decaying vegetation and soil.  The major outdoor mold, Alternaria and Cladosporium, are found in very high numbers during hot and dry conditions.  Generally, a late Summer - Fall peak is seen for common outdoor fungal spores.  Rain will temporarily lower outdoor mold spore count, but counts rise rapidly when the rainy period ends.  The most important indoor molds are Aspergillus and Penicillium.  Dark, humid and poorly ventilated basements are ideal sites for mold growth.  The next most common sites of mold growth are the bathroom and the kitchen.  Outdoor [employer] Use air conditioning and keep windows closed Avoid exposure  to decaying vegetation. Avoid leaf raking. Avoid grain handling. Consider wearing a face mask if working in moldy areas.  Indoor Mold Control Maintain humidity below 50%. Clean washable surfaces with 5% bleach solution. Remove sources e.g. Contaminated carpets.   Control of Dust Mite Allergen Dust mites play a major role in allergic asthma and rhinitis. They occur in environments with high humidity wherever human skin is found.  Dust mites absorb humidity from the atmosphere (ie, they do not drink) and feed on organic matter (including shed human and animal skin). Dust mites are a microscopic type of insect that you cannot see with the naked eye. High levels of dust mites have been detected from mattresses, pillows, carpets, upholstered furniture, bed covers, clothes, soft toys and any woven material. The principal allergen of the dust mite is found in its feces. A gram of dust may contain 1,000 mites and 250,000 fecal particles. Mite antigen is easily measured in the air during house cleaning activities. Dust mites do not bite and do not cause harm to humans, other than by triggering allergies/asthma.  Ways to decrease your exposure to dust mites in your home:  1. Encase mattresses, box springs and pillows with a mite-impermeable barrier or cover  2. Wash sheets, blankets and drapes weekly in hot water (130 F) with detergent and dry them in a dryer on the hot setting.  3. Have the room cleaned frequently with a vacuum cleaner and a damp dust-mop. For carpeting or rugs, vacuuming with a vacuum cleaner equipped with a high-efficiency particulate air (HEPA) filter. The dust mite allergic individual should not be in a room which is being cleaned and should wait 1 hour after cleaning before going into the room.  4. Do not sleep on upholstered furniture (eg, couches).  5. If possible removing carpeting, upholstered furniture and drapery from the home is ideal. Horizontal blinds should be eliminated in the rooms where the person spends the most time (bedroom, study, television room). Washable vinyl, roller-type shades are optimal.  6. Remove all non-washable stuffed toys from the bedroom. Wash stuffed toys weekly like sheets and blankets above.  7. Reduce indoor humidity to less than 50%. Inexpensive humidity monitors can be purchased at most hardware stores. Do not use a humidifier as can make the problem worse and are not  recommended.

## 2021-03-25 NOTE — Progress Notes (Signed)
RE: Natha Guin MRN: [id number] DOB: [birth year] Date of Telemedicine Visit: 03/25/2021  Referring provider: Selinda Flavin, MD Primary care provider: Selinda Flavin, MD  Chief Complaint: Follow-up   Telemedicine Follow Up Visit via Telephone: I connected with [name] for a follow up on 03/25/21 by telephone and verified that I am speaking with the correct person using two identifiers.   I discussed the limitations, risks, security and privacy concerns of performing an evaluation and management service by telephone and the availability of in person appointments. I also discussed with the patient that there may be a patient responsible charge related to this service. The patient expressed understanding and agreed to proceed.  Patient is at home  Provider is at the office.  Visit start time: 235 Visit end time: 256 Insurance consent/check in by: [facility] consent and medical assistant/nurse: Ashleigh  History of Present Illness: She is a 40 y.o. female, who is being followed for asthma, cough, allergic rhinitis, and reflux. Her previous allergy office visit was on 12/19/2020 with Dr. Dellis Anes.  At today's visit, she reports her asthma has been moderately well controlled with symptoms that " come and go" including shortness of breath occasionally with activity especially when overheated, wheeze occurring with activity about 1 week ago which has resolved at this time, and occasional dry cough.  She continues Symbicort 162 puffs twice a day with a spacer and infrequently uses albuterol.  Allergic rhinitis is reported as moderately well controlled with symptoms including occasional clear rhinorrhea, nasal congestion, and sneeze.  She denies postnasal drainage.  She continues cetirizine 10 mg once a day and occasionally uses Flonase as needed with relief of symptoms.  Allergic conjunctivitis is reported as moderately well controlled with occasional red and itchy eyes for which  she is not currently using any medical intervention.  Reflux is reported as moderately well controlled with occasional nausea.  She continues pantoprazole 40 mg every morning and frequently forgets famotidine in the evening.  Her current medications are listed in the chart.   Assessment and Plan: Tyresa is a 40 y.o. female with: Patient Instructions  Asthma Continue Symbicort 160-2 puffs twice a day to prevent cough or wheeze Continue albuterol 2 puffs once every 4 hours as needed for cough or wheeze You may use albuterol 2 puffs 5 to 15 minutes before activity to decrease cough or wheeze  Allergic rhinitis Continue allergen avoidance measures directed toward mold and dust mite as listed below Continue cetirizine 10 mg once a day as needed for runny nose or itch Continue Flonase 2 sprays in each nostril once a day as needed for stuffy nose Consider saline nasal rinses as needed for nasal symptoms. Use this before any medicated nasal sprays for best result  Allergic conjunctivitis Some over the counter eye drops include Pataday one drop in each eye once a day as needed for red, itchy eyes OR Zaditor one drop in each eye twice a day as needed for red itchy eyes.  Reflux Continue dietary and lifestyle modifications as listed below Continue pantoprazole 40 mg once a day to control reflux Began famotidine 20 mg once a day to control reflux  Call the clinic if this treatment plan is not working well for you.  Follow up in the clinic in 4 months or sooner if needed.   Return in about 4 months (around 07/26/2021), or if symptoms worsen or fail to improve.  Meds ordered this encounter  Medications   cetirizine (ZYRTEC) 10 MG  tablet    Sig: Take 1 tablet (10 mg total) by mouth daily.    Dispense:  30 tablet    Refill:  5     Medication List:  Current Outpatient Medications  Medication Sig Dispense Refill   albuterol (VENTOLIN HFA) 108 (90 Base) MCG/ACT inhaler Inhale 2 puffs every  4-6 hours as needed for cough, wheeze, tightness in chest, or shortness of breath. 18 g 1   Ascorbic Acid (VITAMIN C) 100 MG tablet Take 100 mg by mouth daily.     benzonatate (TESSALON) 200 MG capsule Take 1 capsule by mouth three times daily as needed for cough 30 capsule 0   budesonide-formoterol (SYMBICORT) 160-4.5 MCG/ACT inhaler Use 2 puffs twice a day with spacer to help prevent cough and wheeze 1 each 3   cholecalciferol (VITAMIN D3) 25 MCG (1000 UNIT) tablet Take 1,000 Units by mouth daily.     famotidine (PEPCID) 20 MG tablet One after  supper 60 tablet 11   gabapentin (NEURONTIN) 100 MG capsule TAKE 1 CAPSULE BY MOUTH THREE TIMES DAILY 90 capsule 0   ibuprofen (ADVIL) 800 MG tablet Take by mouth.     meloxicam (MOBIC) 15 MG tablet Take 1 tablet by mouth daily.     metroNIDAZOLE (METROGEL) 0.75 % vaginal gel SMARTSIG:Vaginal PRN     pantoprazole (PROTONIX) 40 MG tablet TAKE 1 TABLET BY MOUTH ONCE DAILY 30-60  MINUTES  BEFORE  FIRST  MEAL  OF  THE  DAY 30 tablet 2   permethrin (ELIMITE) 5 % cream Apply from head to toe and leave on overnight. Then wash off in the shower. Repeat in one week if needed. 240 g 0   spironolactone-hydrochlorothiazide (ALDACTAZIDE) 25-25 MG tablet 1-2 daily 60 tablet 11   traZODone (DESYREL) 50 MG tablet Take 50 mg by mouth at bedtime.     triamcinolone (KENALOG) 0.1 % Apply topically.     zinc gluconate 50 MG tablet Take 50 mg by mouth daily.     cetirizine (ZYRTEC) 10 MG tablet Take 1 tablet (10 mg total) by mouth daily. 30 tablet 5   fluticasone (FLONASE) 50 MCG/ACT nasal spray Place 1 spray into both nostrils daily. 1 g 5   No current facility-administered medications for this visit.   Allergies: No Known Allergies I reviewed her past medical history, social history, family history, and environmental history and no significant changes have been reported from previous visit on 12/19/2020.   Objective: Physical Exam Not obtained as encounter was done  via telephone.   Previous notes and tests were reviewed.  I discussed the assessment and treatment plan with the patient. The patient was provided an opportunity to ask questions and all were answered. The patient agreed with the plan and demonstrated an understanding of the instructions.   The patient was advised to call back or seek an in-person evaluation if the symptoms worsen or if the condition fails to improve as anticipated.  I provided 21 minutes of non-face-to-face time during this encounter.  It was my pleasure to participate in Embrie Mikkelsen care today. Please feel free to contact me with any questions or concerns.   Sincerely,  Thermon Leyland, FNP

## 2021-04-09 ENCOUNTER — Other Ambulatory Visit: Payer: Self-pay | Admitting: Internal Medicine

## 2021-05-10 ENCOUNTER — Other Ambulatory Visit: Payer: Self-pay | Admitting: Internal Medicine

## 2021-05-13 ENCOUNTER — Other Ambulatory Visit: Payer: Self-pay

## 2021-05-13 ENCOUNTER — Telehealth: Payer: Self-pay | Admitting: Internal Medicine

## 2021-05-13 MED ORDER — GABAPENTIN 100 MG PO CAPS: 100.0000 mg | ORAL_CAPSULE | Freq: Three times a day (TID) | ORAL | 1 refills | Status: DC

## 2021-05-13 NOTE — Telephone Encounter (Signed)
Pt stated that her cough has come back.  She is requesting that gabapentin be called to her pharmacy.  This was last filled on 12/26/2020 for #90 with no refills.  She does have a pending appt with MW on 07/03/21 in [facility] office.  MW please advise. Thanks

## 2021-05-13 NOTE — Telephone Encounter (Signed)
Gabapentin refilled. Called and notified patient. Nothing further needed.

## 2021-05-13 NOTE — Telephone Encounter (Signed)
Ok to refill 

## 2021-06-07 ENCOUNTER — Other Ambulatory Visit: Payer: Self-pay | Admitting: Internal Medicine

## 2021-07-03 ENCOUNTER — Ambulatory Visit: Payer: Medicaid Other | Admitting: Internal Medicine

## 2021-07-03 ENCOUNTER — Other Ambulatory Visit: Payer: Self-pay

## 2021-07-03 ENCOUNTER — Encounter: Payer: Self-pay | Admitting: Internal Medicine

## 2021-07-03 DIAGNOSIS — J310 Chronic rhinitis: Secondary | ICD-10-CM | POA: Diagnosis not present

## 2021-07-03 DIAGNOSIS — J45991 Cough variant asthma: Secondary | ICD-10-CM | POA: Diagnosis not present

## 2021-07-03 NOTE — Patient Instructions (Addendum)
I will be referring you to an ENT doctor for your sinuses   Keep up appts with Dr Ernst Bowler and if not doing well and not requiring shots then return with all your medications in hand to include your inhalers  Pulmonary follow up is as needed

## 2021-07-03 NOTE — Progress Notes (Signed)
Shelley Richardson, female    DOB: 1981-02-22, 41 y.o.   MRN: [id number]   Brief patient profile:  27   yowf never smoker never resp problem with last IUP 0000000 no resp complications with baseline wt low 300's the around w/in a year of last IUP   new onset doe / cough typically in fall but not require rx or inhalers typical fall symptoms in Nov 2020 then xmas 2020 > dec 29th 2020 pos covid then admitted [facility] Jan 3rd 2021  rx 02/ abx / steroids / anticoagulants and h2 eval by [facility] 07/27/19 by then had weaned off 02 and rx and rx augmentin/ prednisone since then good days and bad days with doe  With more cough on bad days some better transiently on pred but not consistent referred to pulmonary clinic 10/10/2019 by Dr   Nadara Mustard      History of Present Illness  10/10/2019  Pulmonary/ 1st office eval/Jeshawn Melucci  p covid Dec 2020  Chief Complaint  Patient presents with   Pulmonary Consult    Referred by [place]. Pt c/o cough, SOB, HA, CP since had Covod 19 in Dec 2020. Her cough is non prod.    Dyspnea:  Maybe 50-100 ft flat  Cough: sporadic,  Worse at hs/ uses lots of cough drops  Sleep: lie flat 2 pillows  SABA use: not using, doesn't help rec Pantoprazole (protonix) 40 mg   Take  30-60 min before first meal of the day and Pepcid (famotidine)  20 mg one after supper  until return to office - this is the best way to tell whether stomach acid is contributing to your problem.   GERD diet  Prednisone 10 mg take  4 each am x 2 days,   2 each am x 2 days,  1 each am x 2 days and stop  For cough > tessalon 200 mg every 6 hours as needed     11/18/2019  f/u ov/Maurene Hollin re: post covid cough  Chief Complaint  Patient presents with   Follow-up    pt states sob wwhen walking long distance.  Dyspnea:  Making slow progress Cough: better / some tickle /some sensation of pills getting stuck Sleeping: flat with 2 pillows  SABA use: none 02: none  rec To get the most out of  exercise, you need to be continuously aware that you are short of breath, but never out of breath, for at least 20 minutes daily. As you improve, it will actually be easier for you to do the same amount of exercise  in  30 minutes so always push to the level where you are short of breath.   No change in medications Please remember to go to the  x-ray department  for your tests - we will call you with the results when they are available   Please schedule a follow up office visit in 8  weeks, sooner if needed      01/06/2020  f/u ov/Enzo Treu re: post covid Dec 2020  Chief Complaint  Patient presents with   Follow-up   cp x years lasts from sev  hours to all day  Worse with  drinking cold drinks /with deep breath / not noct  Dyspnea: not very active gets let off at door but better since last ov  Cough: off and on s pattern, no excess mucus   Sleeping: on side /not using bed blocks  SABA use: none  02:  None  rec  Stop pantopzole and start taking pepcid (famotidine) after first meal and last meal. Please remember to go to the lab department   for your tests - we will call you with the results when they are available.       02/17/2020  f/u ov/[place] office/Khaylee Mcevoy re:  Doe/ cough  And chest pain all predated covid by years now with leg swelling worse  Chief Complaint  Patient presents with   Follow-up    Patient is still having some chest tightness, hurts to breath and feels like she is swollen. Shortness of breath with exertion. Covid in December. Cough is better with the tessalon perles.  Dyspnea: x 7 years / Cough: better less need for tessalon Sleeping: flat surface / one pillow / bed blocks not being used wakes up sev times a week with cough/ wheeze, sob and helps to sit up  SABA use: none  02: none  Cp midline x couple of years sometimes all day long = chest tightness worse since covid but present before covid seen in ER [facility] ? Better p prednisone > records req rec Aldactazide 25-25  take   1 or 2 each am and avoid salt and salty food  I will check [facility] records [facility] for your chest pain prior to covid 2019-2020 Please schedule a follow up office visit in 6-8  Weeks with PFTs on return  >   Not able    04/13/2020  f/u ov/[place] office/Ilee Randleman re:  Cough/ sob worse since covid  Chief Complaint  Patient presents with   Follow-up    sore throat, productive cough at times with green phlegm for past month  Dyspnea:  No regular walking  Cough: worse since yelled at football game Sleeping: no bed blocks  SABA [id number] 02 none Rec Gabapentin 100 mg three times  A day should gradually eliminate the throat pain and cough To get the most out of exercise, you need to be continuously aware that you are short of breath, but never out of breath, for 30 minutes daily. Pantoprazole (protonix) 40 mg   Take  30-60 min before first meal of the day and Pepcid (famotidine)  20 mg one @  bedtime until return to office - this is the best way to tell whether stomach acid is contributing to your problem.   We are referring you to an ENT doctor > req change to ENT > not seen as of 07/03/2021    07/03/2021  f/u ov/[place] office/Matheus Spiker re: cough x 2014  maint on symbicort 160  Chief Complaint  Patient presents with   Follow-up    Cough is still persistent if pt isnt taking medication.  Feels she may  have sinus infection.     Dyspnea:  do ok walking at work = [place] = can't walk a nl pace on a flat grade s sob but does fine slow and flat    Cough: better with gabapentin despite persistent nasal symptoms  Sleeping: on side bed is flat  SABA use: not using  02: none  Covid status: x 2      No obvious day to day or daytime variability or assoc excess/ purulent sputum or mucus plugs or hemoptysis or cp or chest tightness, subjective wheeze or overt  hb symptoms.   Sleeping  without nocturnal  or early am exacerbation  of respiratory  c/o's or need for noct saba. Also denies any  obvious fluctuation of symptoms with weather or environmental changes or other aggravating  or alleviating factors except as outlined above   No unusual exposure hx or h/o childhood pna/ asthma or knowledge of premature birth.  Current Allergies, Complete Past Medical History, Past Surgical History, Family History, and Social History were reviewed in [employer] record.  ROS  The following are not active complaints unless bolded Hoarseness, sore throat, dysphagia, dental problems, itching, sneezing,  nasal congestion or discharge of excess mucus or purulent secretions, ear ache,   fever, chills, sweats, unintended wt loss or wt gain, classically pleuritic or exertional cp,  orthopnea pnd or arm/hand swelling  or leg swelling, presyncope, palpitations, abdominal pain, anorexia, nausea, vomiting, diarrhea  or change in bowel habits or change in bladder habits, change in stools or change in urine, dysuria, hematuria,  rash, arthralgias, visual complaints, headache, numbness, weakness or ataxia or problems with walking or coordination,  change in mood or  memory.        Current Meds  Medication Sig   albuterol (VENTOLIN HFA) 108 (90 Base) MCG/ACT inhaler Inhale 2 puffs every 4-6 hours as needed for cough, wheeze, tightness in chest, or shortness of breath.   Ascorbic Acid (VITAMIN C) 100 MG tablet Take 100 mg by mouth daily.   benzonatate (TESSALON) 200 MG capsule Take 1 capsule by mouth three times daily as needed for cough   budesonide-formoterol (SYMBICORT) 160-4.5 MCG/ACT inhaler Use 2 puffs twice a day with spacer to help prevent cough and wheeze   cetirizine (ZYRTEC) 10 MG tablet Take 1 tablet (10 mg total) by mouth daily.   cholecalciferol (VITAMIN D3) 25 MCG (1000 UNIT) tablet Take 1,000 Units by mouth daily.   famotidine (PEPCID) 20 MG tablet One after  supper   ibuprofen (ADVIL) 800 MG tablet Take by mouth.   meloxicam (MOBIC) 15 MG tablet Take 1 tablet by mouth daily.    metroNIDAZOLE (METROGEL) 0.75 % vaginal gel SMARTSIG:Vaginal PRN   pantoprazole (PROTONIX) 40 MG tablet TAKE 1 TABLET BY MOUTH ONCE DAILY 30-60  MINUTES  BEFORE  FIRST  MEAL  OF  THE  DAY   permethrin (ELIMITE) 5 % cream Apply from head to toe and leave on overnight. Then wash off in the shower. Repeat in one week if needed.   spironolactone-hydrochlorothiazide (ALDACTAZIDE) 25-25 MG tablet 1-2 daily   traZODone (DESYREL) 50 MG tablet Take 50 mg by mouth at bedtime.   triamcinolone (KENALOG) 0.1 % Apply topically.   zinc gluconate 50 MG tablet Take 50 mg by mouth daily.                     Objective:     07/03/2021       289 04/13/2020      336  02/17/2020        353   01/06/2020       356   11/18/19 (!) 357 lb 4.8 oz (162.1 kg)  10/10/19 (!) 360 lb (163.3 kg)     Vital signs reviewed  07/03/2021  - Note at rest 02 sats  97% on RA   General appearance:    obese somber wf nad   HEENT : pt wearing mask not removed for exam due to covid -19 concerns.    NECK :  without JVD/Nodes/TM/ nl carotid upstrokes bilaterally   LUNGS: no acc muscle use,  Nl contour chest which is clear to A and P bilaterally without cough on insp or exp maneuvers   CV:  RRR  no s3 or murmur  or increase in P2, and no edema   ABD:  soft and nontender with nl inspiratory excursion in the supine position. No bruits or organomegaly appreciated, bowel sounds nl  MS:  Nl gait/ ext warm without deformities, calf tenderness, cyanosis or clubbing No obvious joint restrictions   SKIN: warm and dry without lesions    NEURO:  alert, approp, nl sensorium with  no motor or cerebellar deficits apparent.     Assessment

## 2021-07-04 ENCOUNTER — Telehealth: Payer: Self-pay | Admitting: Internal Medicine

## 2021-07-04 ENCOUNTER — Encounter: Payer: Self-pay | Admitting: Internal Medicine

## 2021-07-04 NOTE — Assessment & Plan Note (Addendum)
Onset seasonal cough ? Around 2014/ mostly in fall - cyclical cough rx 0000000 > improved - Allergy profile 01/06/2020 >  Eos 0.1 /  IgE  7 - 01/06/2020 try changing ppi to h2 bid > no worse off PPI as of 02/17/2020  - 04/13/2020 added gabapentin 100 tid and back on gerd rx with Ent f/u  >says  insurance review won't pay for ent, wants her seen by allergy so referred 05/21/2020   - 07/03/2021 referred to ENT again   Cough is better but upper airway problems of rhinitis and ? Sinus HA are not and rather than start additional empirical rx at this point better to see ENT as originally planned for problem x 2014 unlikely to solve in pulmonary clinic.  Also seeing allergy so f/u here can be prn p allergy eval complete but must return with all meds in hand using a trust but verify approach to confirm accurate Medication  Reconciliation The principal here is that until we are certain that the  patients are doing what we've asked, it makes no sense to ask them to do more.          Each maintenance medication was reviewed in detail including emphasizing most importantly the difference between maintenance and prns and under what circumstances the prns are to be triggered using an action plan format where appropriate.  Total time for H and P, chart review, counseling, reviewing hfa device(s) and generating customized AVS unique to this summary office visit / same day charting = 31 min

## 2021-07-04 NOTE — Telephone Encounter (Signed)
Dr. Sherene Sires, please advise if you would be okay writing a letter so that pt can give to her son's school since he was with her during the appt.

## 2021-07-04 NOTE — Assessment & Plan Note (Signed)
Baseline wt around 300 lb at  last IUP around 2013   Body mass index is 51.19 kg/m.  -  trending down, encouraged Lab Results  Component Value Date   TSH 2.48 01/06/2020      Contributing to doe and risk of GERD >>>   reviewed the need and the process to achieve and maintain neg calorie balance > defer f/u primary care including intermittently monitoring thyroid status

## 2021-07-05 ENCOUNTER — Encounter: Payer: Self-pay | Admitting: Internal Medicine

## 2021-07-05 NOTE — Telephone Encounter (Signed)
That is fine 

## 2021-07-05 NOTE — Telephone Encounter (Signed)
Patient returned call. Would like sons name Detta Malcom added to letter and letter to be faxed to [employer] [phone].   Faxed to [employer]  by req. Of patient and received fax confirmation. Patient states she does not need a copy of letter.

## 2021-07-05 NOTE — Telephone Encounter (Signed)
Letter typed and printed and sent through mychart. Called pt and LVM letting her know letter was done and she could access on mychart or pick up at the office. Advised her to call back if needed.

## 2021-07-27 ENCOUNTER — Other Ambulatory Visit: Payer: Self-pay | Admitting: Internal Medicine

## 2021-07-29 ENCOUNTER — Other Ambulatory Visit: Payer: Self-pay

## 2021-09-19 ENCOUNTER — Other Ambulatory Visit: Payer: Self-pay | Admitting: Internal Medicine

## 2021-10-16 ENCOUNTER — Other Ambulatory Visit: Payer: Self-pay | Admitting: Internal Medicine

## 2021-10-17 ENCOUNTER — Other Ambulatory Visit: Payer: Self-pay

## 2021-11-29 ENCOUNTER — Other Ambulatory Visit: Payer: Self-pay | Admitting: Internal Medicine

## 2021-12-10 NOTE — Progress Notes (Unsigned)
   [address] [place] [address] Dept: [phone]  FOLLOW UP NOTE  Patient ID: Shelley Richardson, female    DOB: [birth year]  Age: 41 y.o. MRN: [id number] Date of Office Visit: 12/11/2021  Assessment  Chief Complaint: No chief complaint on file.  HPI Shelley Richardson is a 41 year old female who presents to the clinic for follow-up visit.  She was last seen in this clinic on 03/25/2021 via telephone visit with Thermon Leyland, FNP, for evaluation of asthma, cough, allergic rhinitis, reflux, and allergic conjunctivitis.  Her last environmental allergy skin testing was on 07/18/2020 and was positive to mold and dust mite.   Drug Allergies:  No Known Allergies  Physical Exam: There were no vitals taken for this visit.   Physical Exam  Diagnostics:    Assessment and Plan: No diagnosis found.  No orders of the defined types were placed in this encounter.   There are no Patient Instructions on file for this visit.  No follow-ups on file.    Thank you for the opportunity to care for this patient.  Please do not hesitate to contact me with questions.  Thermon Leyland, FNP [facility]

## 2021-12-10 NOTE — Patient Instructions (Incomplete)
Asthma Increase Symbicort 160 to 2 puffs twice a day with a spacer to prevent cough or wheeze Continue albuterol 2 puffs once every 4 hours as needed for cough or wheeze You may use albuterol 2 puffs 5 to 15 minutes before activity to decrease cough or wheeze  Allergic rhinitis Continue allergen avoidance measures directed toward mold and dust mite as listed below Begin levocetirizine 5 mg once a day as needed for runny nose or itch Continue Flonase 2 sprays in each nostril once a day as needed for stuffy nose Consider saline nasal rinses as needed for nasal symptoms. Use this before any medicated nasal sprays for best result  Allergic conjunctivitis Begin olopatadine eye drops one drop in each eye once a day as needed for red or itchy eyes  Reflux Continue dietary and lifestyle modifications as listed below Restart pantoprazole 40 mg once a day to control reflux Increase famotidine 20 mg to twice a day to control reflux  Food allergy  Your food allergy skin testing was negative at today's visit. You do not need to avoid an foods.  Insect bites Apply ice to affected areas Continue cetirizine 10 mg as needed for itch. You may take an additional cetirizine once a day as needed for breakthrough symptoms Apply triamcinolone 0.1% ointment to red and itchy areas under your face up to twice a day as needed  Call the clinic if this treatment plan is not working well for you.  Follow up in the clinic in 2 months or sooner if needed.   Lifestyle Changes for Controlling GERD When you have GERD, stomach acid feels as if it's backing up toward your mouth. Whether or not you take medication to control your GERD, your symptoms can often be improved with lifestyle changes.   Raise Your Head Reflux is more likely to strike when you're lying down flat, because stomach fluid can flow backward more easily. Raising the head of your bed 4-6 inches can help. To do this: Slide blocks or books under  the legs at the head of your bed. Or, place a wedge under the mattress. Many foam stores can make a suitable wedge for you. The wedge should run from your waist to the top of your head. Don't just prop your head on several pillows. This increases pressure on your stomach. It can make GERD worse.  Watch Your Eating Habits Certain foods may increase the acid in your stomach or relax the lower esophageal sphincter, making GERD more likely. It's best to avoid the following: Coffee, tea, and carbonated drinks (with and without caffeine) Fatty, fried, or spicy food Mint, chocolate, onions, and tomatoes Any other foods that seem to irritate your stomach or cause you pain  Relieve the Pressure Eat smaller meals, even if you have to eat more often. Don't lie down right after you eat. Wait a few hours for your stomach to empty. Avoid tight belts and tight-fitting clothes. Lose excess weight.  Tobacco and Alcohol Avoid smoking tobacco and drinking alcohol. They can make GERD symptoms worse.  Control of Mold Allergen Mold and fungi can grow on a variety of surfaces provided certain temperature and moisture conditions exist.  Outdoor molds grow on plants, decaying vegetation and soil.  The major outdoor mold, Alternaria and Cladosporium, are found in very high numbers during hot and dry conditions.  Generally, a late Summer - Fall peak is seen for common outdoor fungal spores.  Rain will temporarily lower outdoor mold spore count, but counts   rise rapidly when the rainy period ends.  The most important indoor molds are Aspergillus and Penicillium.  Dark, humid and poorly ventilated basements are ideal sites for mold growth.  The next most common sites of mold growth are the bathroom and the kitchen.  Outdoor Mold Control Use air conditioning and keep windows closed Avoid exposure to decaying vegetation. Avoid leaf raking. Avoid grain handling. Consider wearing a face mask if working in moldy  areas.  Indoor Mold Control Maintain humidity below 50%. Clean washable surfaces with 5% bleach solution. Remove sources e.g. Contaminated carpets.   Control of Dust Mite Allergen Dust mites play a major role in allergic asthma and rhinitis. They occur in environments with high humidity wherever human skin is found. Dust mites absorb humidity from the atmosphere (ie, they do not drink) and feed on organic matter (including shed human and animal skin). Dust mites are a microscopic type of insect that you cannot see with the naked eye. High levels of dust mites have been detected from mattresses, pillows, carpets, upholstered furniture, bed covers, clothes, soft toys and any woven material. The principal allergen of the dust mite is found in its feces. A gram of dust may contain 1,000 mites and 250,000 fecal particles. Mite antigen is easily measured in the air during house cleaning activities. Dust mites do not bite and do not cause harm to humans, other than by triggering allergies/asthma.  Ways to decrease your exposure to dust mites in your home:  1. Encase mattresses, box springs and pillows with a mite-impermeable barrier or cover  2. Wash sheets, blankets and drapes weekly in hot water (130 F) with detergent and dry them in a dryer on the hot setting.  3. Have the room cleaned frequently with a vacuum cleaner and a damp dust-mop. For carpeting or rugs, vacuuming with a vacuum cleaner equipped with a high-efficiency particulate air (HEPA) filter. The dust mite allergic individual should not be in a room which is being cleaned and should wait 1 hour after cleaning before going into the room.  4. Do not sleep on upholstered furniture (eg, couches).  5. If possible removing carpeting, upholstered furniture and drapery from the home is ideal. Horizontal blinds should be eliminated in the rooms where the person spends the most time (bedroom, study, television room). Washable vinyl, roller-type  shades are optimal.  6. Remove all non-washable stuffed toys from the bedroom. Wash stuffed toys weekly like sheets and blankets above.  7. Reduce indoor humidity to less than 50%. Inexpensive humidity monitors can be purchased at most hardware stores. Do not use a humidifier as can make the problem worse and are not recommended.   Strategies for Safer Mosquito Avoidance  by Fawn Pattison   Mosquitoes are a terrible nuisance in the muggy summer months, especially now that the ferocious Asian tiger mosquito has made a permanent home here in [place]. The arrival of West Nile virus has added some urgency to mosquito control measures, but spray programs and many repellents may do more harm than good in the long term. Choosing the least-toxic solutions can protect both your health and comfort in mosquito season. Here are some suggestions for safer and more effective bite avoidance this summer.   Population Control  Keeping mosquito populations in check is the most important way to avoid bites. It's no secret that removing sources of standing water is crucial to eliminating mosquito breeding grounds. Common breeding sites to watch for include:  * Rain gutters. Clean   them out and offer to do the same for elderly neighbors or others who may not be able to do the job themselves. Remember that mosquito control is a community-wide effort.  * Flowerpots, buckets and old tires. Be sure empty containers cannot hold water.  * Bird baths and pet dishes. Empty and clean them weekly.  * Recycling bins and the cans inside. These may harbor stagnant water if not emptied regularly.  * Rain barrels. Be sure they are sealed off from mosquitoes.  * Storm drains. Watch for clogs from branches and garbage.  Insecticide sprays targeting adult mosquitoes can only reduce mosquito populations for a day or two. In fact, since insecticides also kill off important mosquito predators such as dragonflies, a spray program can  actually be counter-productive by leaving the rebounding mosquito population without natural enemies.  Instead, interrupt the breeding cycle by using the nontoxic bacterial larvicide Bacillus thuringiensis var. israelensis (Bti). Bti is sold in convenient donuts called "mosquito dunks" that you can safely use in your bird bath, rain barrel or low areas around your yard to kill mosquito larvae before the adults emerge and spread throughout the community, where they become much harder to kill. Bti is not harmful to fish, birds or mammals, and single applications can remain effective for a month or more, even if the water source dries out and refills.   Safer Repellents  If you'll be outdoors at dawn or dusk when mosquitoes are most active, wear long clothes that don't leave skin exposed. (You may use insect repellent on your clothes). When you do get bites, soothe them by slathering on an astringent such as witch hazel after you come inside -- it will prevent scratching and allow bites to heal quickly.  Lately many public health officials concerned about West Nile virus have been advising people to use repellents containing the pesticide DEET (N,N-diethyl-meta-toluamide). While DEET is an extremely effective mosquito repellent, it is also a neurotoxin, and studies have shown that prolonged frequent exposure can irritate skin, cause muscle twitching and weakness and harm the brain and nervous system, especially when combined with other pesticides such as permethrin.  Consumer studies report that [employer]'s Skin-So-Soft and herbal repellents containing citronella can be just as effective as DEET at repelling mosquitoes but need to be applied more often. The solution is to choose the safer formulas and reapply as needed.  General guidelines for using any insect repellent:  * Choose oils or lotions rather than sprays, which produce fine particles that are easily inhaled.  * Do not apply repellents to broken skin.  *  Do not allow children to apply their own repellent, and do not apply repellents containing DEET or other pesticides directly to children's skin. If you use such products, they can be applied to children's clothing instead.  * Do not use sunscreen/repellent combinations. Sunscreen needs to be reapplied more often than repellents, so the combination products can result in overexposure to pesticides.  * Wash off all repellent from skin and clothing immediately after coming indoors.  Area-wide repellent strategies can also be effective for outdoor gatherings. There are various contraptions available that emit carbon dioxide to trap mosquitoes (such as the Mosquito Magnet and Mosquito Deleto). These are expensive, but they do work, and some companies will even rent them to you for an outdoor event. Citronella candles are also effective when there is no breeze, but beware of candles containing pesticides -- the smoke is easily inhaled and can irritate the airway.   Placing fans around your porch or patio can blow mosquitoes away.  Keep in mind that only female mosquitoes actually bite and that most mosquito species in this area do not transmit West Nile virus. You are most at risk of being bitten by a mosquito carrying the disease at dawn and dusk, and even in these cases your chances of actually contracting the virus are extremely low. So take sensible steps to keep the buggers under control, but also keep them in perspective as the annoyances they are.          

## 2021-12-11 ENCOUNTER — Ambulatory Visit: Payer: Medicaid Other | Admitting: Family Medicine

## 2021-12-11 ENCOUNTER — Encounter: Payer: Self-pay | Admitting: Family Medicine

## 2021-12-11 VITALS — BP 142/90 | HR 74 | Temp 97.9°F | Resp 16 | Ht 63.0 in | Wt 294.8 lb

## 2021-12-11 DIAGNOSIS — K219 Gastro-esophageal reflux disease without esophagitis: Secondary | ICD-10-CM

## 2021-12-11 DIAGNOSIS — J3089 Other allergic rhinitis: Secondary | ICD-10-CM | POA: Diagnosis not present

## 2021-12-11 DIAGNOSIS — H1013 Acute atopic conjunctivitis, bilateral: Secondary | ICD-10-CM

## 2021-12-11 DIAGNOSIS — J4520 Mild intermittent asthma, uncomplicated: Secondary | ICD-10-CM

## 2021-12-11 DIAGNOSIS — J302 Other seasonal allergic rhinitis: Secondary | ICD-10-CM | POA: Insufficient documentation

## 2021-12-11 DIAGNOSIS — J45991 Cough variant asthma: Secondary | ICD-10-CM

## 2021-12-11 DIAGNOSIS — H1010 Acute atopic conjunctivitis, unspecified eye: Secondary | ICD-10-CM

## 2021-12-11 DIAGNOSIS — W57XXXD Bitten or stung by nonvenomous insect and other nonvenomous arthropods, subsequent encounter: Secondary | ICD-10-CM

## 2021-12-11 DIAGNOSIS — W57XXXA Bitten or stung by nonvenomous insect and other nonvenomous arthropods, initial encounter: Secondary | ICD-10-CM | POA: Insufficient documentation

## 2021-12-11 MED ORDER — FAMOTIDINE 20 MG PO TABS: ORAL_TABLET | ORAL | 11 refills | Status: DC

## 2021-12-11 MED ORDER — ALBUTEROL SULFATE HFA 108 (90 BASE) MCG/ACT IN AERS: INHALATION_SPRAY | RESPIRATORY_TRACT | 1 refills | Status: DC

## 2021-12-11 MED ORDER — TRIAMCINOLONE ACETONIDE 0.1 % EX OINT
1.0000 | TOPICAL_OINTMENT | Freq: Two times a day (BID) | CUTANEOUS | 0 refills | Status: DC | PRN
Start: 2021-12-11 — End: 2022-06-13

## 2021-12-11 MED ORDER — BUDESONIDE-FORMOTEROL FUMARATE 160-4.5 MCG/ACT IN AERO: INHALATION_SPRAY | RESPIRATORY_TRACT | 3 refills | Status: DC

## 2021-12-11 MED ORDER — FLUTICASONE PROPIONATE 50 MCG/ACT NA SUSP: 1.0000 | Freq: Every day | NASAL | 5 refills | Status: DC

## 2021-12-11 MED ORDER — CETIRIZINE HCL 10 MG PO TABS: 10.0000 mg | ORAL_TABLET | Freq: Every day | ORAL | 5 refills | Status: DC

## 2021-12-12 NOTE — Addendum Note (Signed)
Addended by: Orson Aloe on: 12/12/2021 04:34 PM   Modules accepted: Orders

## 2022-01-05 ENCOUNTER — Other Ambulatory Visit: Payer: Self-pay | Admitting: Internal Medicine

## 2022-01-06 ENCOUNTER — Other Ambulatory Visit: Payer: Self-pay

## 2022-01-06 MED ORDER — GABAPENTIN 100 MG PO CAPS: 100.0000 mg | ORAL_CAPSULE | Freq: Three times a day (TID) | ORAL | 3 refills | Status: DC

## 2022-01-06 NOTE — Telephone Encounter (Signed)
Received paper refill request from [facility] [place] for refill on gabapentin 100 mg TID 01/06/2022 9:52 am

## 2022-03-16 IMAGING — DX DG CHEST 2V
2 series · 2 of 2 positions shown · non-contrast
Comparison: Single-view of the chest 06/26/2019. PA and lateral
chest 02/11/2016. CT chest 12/18/2015.

CLINICAL DATA: Shortness of breath, chest pain and cough since the
patient was diagnosed with [id number] in May 2019.

EXAM:
CHEST - 2 VIEW

[chest pa]
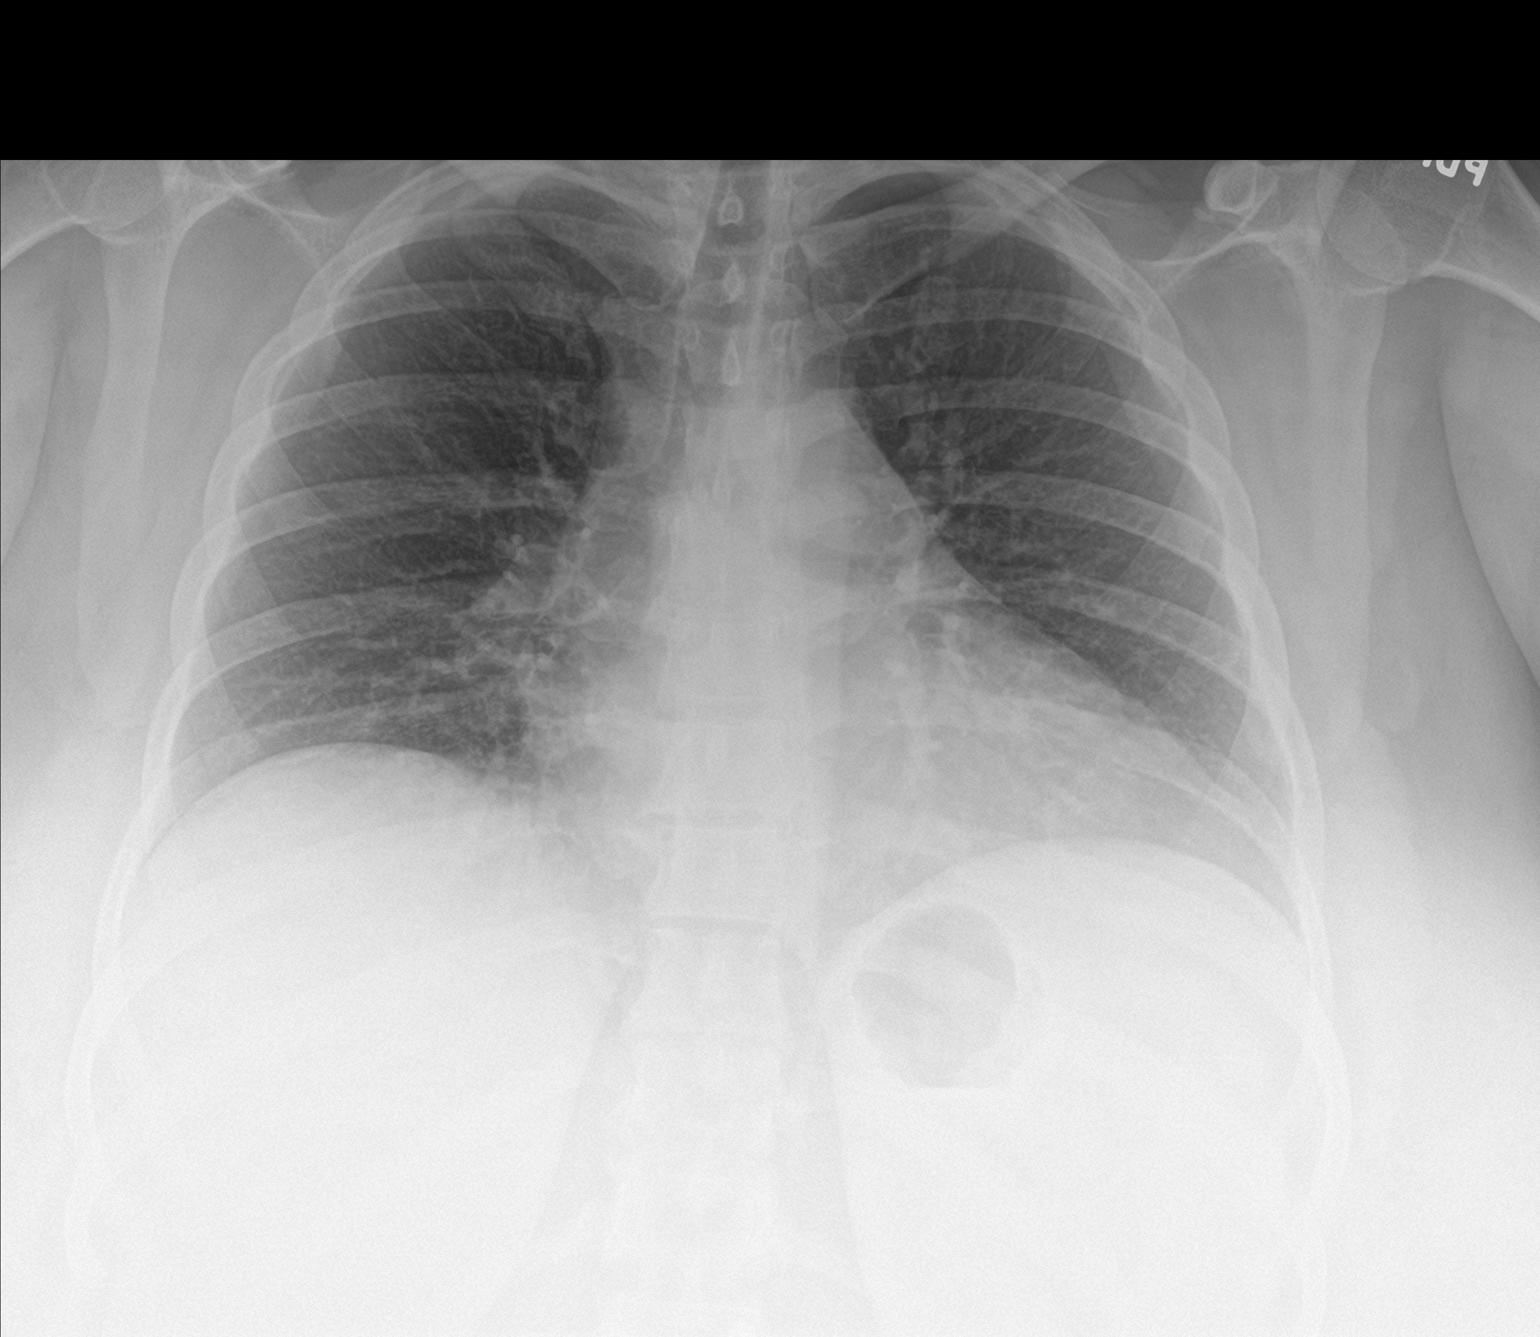

[chest lat]
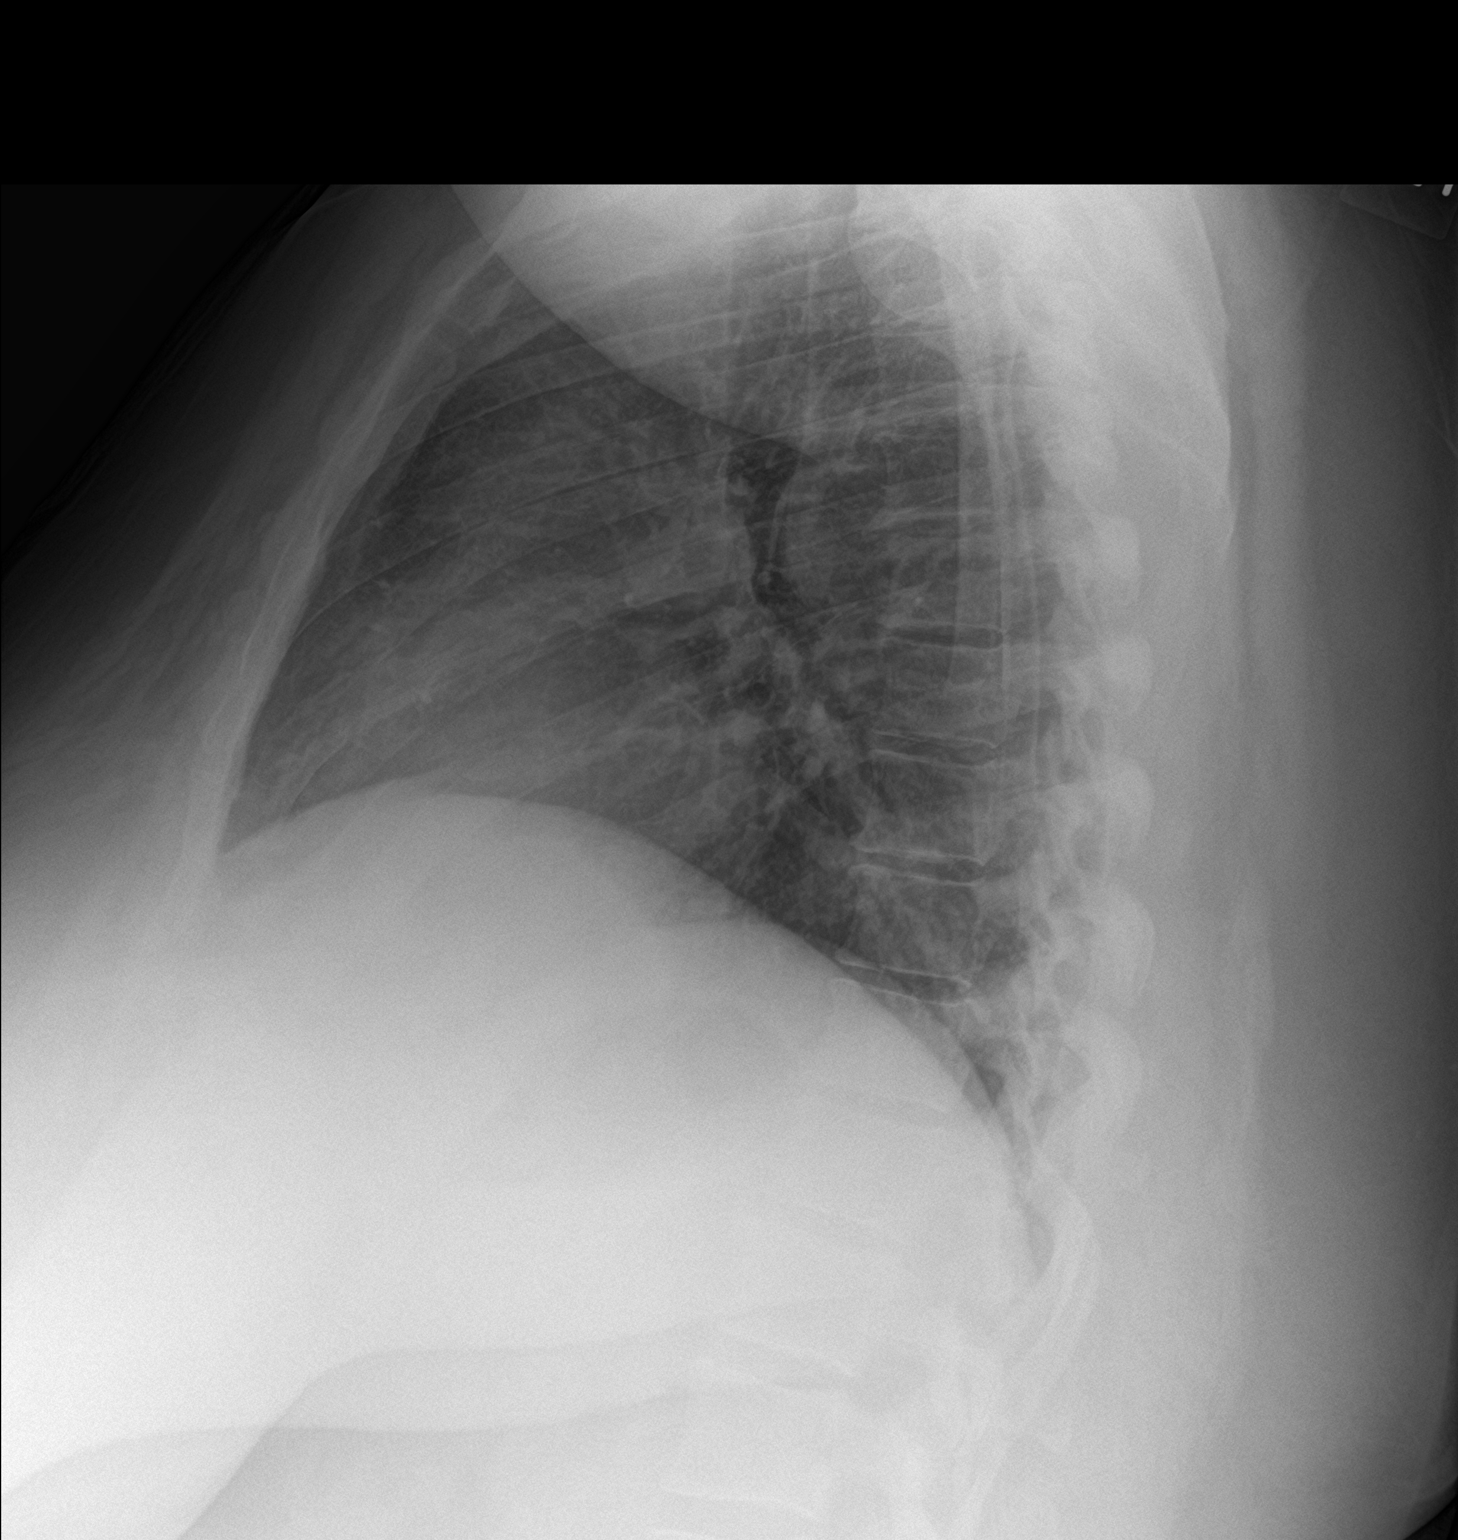

[2 of 2 positions shown; findings below may reference images not displayed]

FINDINGS: The lungs are clear. Heart size is upper normal. No pneumothorax or
pleural effusion. No acute or focal bony abnormality.
IMPRESSION: No acute disease.

## 2022-04-01 ENCOUNTER — Other Ambulatory Visit: Payer: Self-pay | Admitting: Internal Medicine

## 2022-05-22 ENCOUNTER — Other Ambulatory Visit: Payer: Self-pay | Admitting: Internal Medicine

## 2022-06-13 ENCOUNTER — Other Ambulatory Visit: Payer: Self-pay | Admitting: Internal Medicine

## 2022-06-13 ENCOUNTER — Other Ambulatory Visit: Payer: Self-pay

## 2022-06-13 ENCOUNTER — Encounter: Payer: Self-pay | Admitting: Allergy & Immunology

## 2022-06-13 ENCOUNTER — Ambulatory Visit: Payer: Medicaid Other | Admitting: Allergy & Immunology

## 2022-06-13 VITALS — BP 160/80 | HR 108 | Temp 98.0°F | Resp 16 | Ht 63.25 in | Wt 303.4 lb

## 2022-06-13 DIAGNOSIS — J4541 Moderate persistent asthma with (acute) exacerbation: Secondary | ICD-10-CM | POA: Diagnosis not present

## 2022-06-13 DIAGNOSIS — K219 Gastro-esophageal reflux disease without esophagitis: Secondary | ICD-10-CM

## 2022-06-13 DIAGNOSIS — J3089 Other allergic rhinitis: Secondary | ICD-10-CM

## 2022-06-13 DIAGNOSIS — J302 Other seasonal allergic rhinitis: Secondary | ICD-10-CM

## 2022-06-13 MED ORDER — BUDESONIDE-FORMOTEROL FUMARATE 160-4.5 MCG/ACT IN AERO: INHALATION_SPRAY | RESPIRATORY_TRACT | 3 refills | Status: DC

## 2022-06-13 MED ORDER — PREDNISONE 10 MG PO TABS: ORAL_TABLET | ORAL | 0 refills | Status: DC

## 2022-06-13 MED ORDER — FLUTICASONE PROPIONATE 50 MCG/ACT NA SUSP: 1.0000 | Freq: Every day | NASAL | 5 refills | Status: DC

## 2022-06-13 MED ORDER — CETIRIZINE HCL 10 MG PO TABS: 10.0000 mg | ORAL_TABLET | Freq: Every day | ORAL | 5 refills | Status: DC

## 2022-06-13 MED ORDER — TRIAMCINOLONE ACETONIDE 0.1 % EX OINT: 1.0000 | TOPICAL_OINTMENT | Freq: Two times a day (BID) | CUTANEOUS | 0 refills | Status: DC | PRN

## 2022-06-13 NOTE — Progress Notes (Signed)
FOLLOW UP  Date of Service/Encounter:  06/13/22   Assessment:   Cough variant asthma with acute exacerbation   COVID long-hauler   Perennial and seasonal allergic rhinitis (indoor molds, outdoor molds and dust mites)   I am not convinced that she is having a true asthma exacerbation, but we Argun to put her on a low-dose prednisone burst.  We are going get some labs in case we need to advance treatment to a biologic.  I think a lot of her spirometry findings are related to poor effort, but time will tell.  For her allergic rhinitis, working to continue with Zyrtec and Flonase.   Plan/Recommendations:   1. Cough variant asthma with acute exacerbation - Lung testing looked worse today but it did improve markedly with the Xopenex nebulizer treatment. - We are starting a prednisone burst today to get your breathing under better control. - We are going to look into starting an injectable asthma medication, but we need blood work before this is done.  - Do NOT start the prednisone until you get these labs done!  - Daily controller medication(s): Flovent 2 puffs twice daily with spacer - Prior to physical activity: albuterol 2 puffs 10-15 minutes before physical activity. - Rescue medications: albuterol 4 puffs every 4-6 hours as needed - Asthma control goals:  * Full participation in all desired activities (may need albuterol before activity) * Albuterol use two time or less a week on average (not counting use with activity) * Cough interfering with sleep two time or less a month * Oral steroids no more than once a year * No hospitalizations  2. Chronic rhinitis (indoor molds, outdoor molds and dust mites) - I sent in your medications (sorry that was not done last time).  - Continue taking: Zyrtec (cetirizine) 10mg  tablet once daily and Flonase (fluticasone) one spray per nostril daily - You can use an extra dose of the antihistamine, if needed, for breakthrough symptoms.  -  Consider nasal saline rinses 1-2 times daily to remove allergens from the nasal cavities as well as help with mucous clearance (this is especially helpful to do before the nasal sprays are given)  3. Return in about 3 months (around 09/12/2022).   Subjective:   Shelley Richardson is a 41 y.o. female presenting today for follow up of  Chief Complaint  Patient presents with   Follow-up   Sinus Problem    Shelley Richardson has a history of the following: Patient Active Problem List   Diagnosis Date Noted   Bite, insect 12/11/2021   Seasonal and perennial allergic rhinitis 12/11/2021   Chronic rhinitis 03/25/2021   Seasonal allergic conjunctivitis 03/25/2021   Gastroesophageal reflux disease 03/25/2021   Peripheral edema 02/17/2020   DOE (dyspnea on exertion) 10/10/2019   Cough variant asthma vs UACS  10/10/2019   Morbid obesity due to excess calories (HCC) 10/10/2019    History obtained from: chart review and patient.  Shelley Richardson is a 41 y.o. female presenting for a follow up visit.  Last seen in June 2023 by July 2023 one of our nurse practitioners.  At that time, her Symbicort was increased to 2 puffs twice daily and she was continued on albuterol.  For her allergic rhinitis, she was continued on Zyrtec and Flonase.  Started on Pataday eyedrops.  Reflux was controlled with pantoprazole and famotidine was added as well.  Since last visit, she has done well. She is now working at [employer] and then part  time at [employer].  Asthma/Respiratory Symptom History: She remains on the Symbicort BID. She does walk a lot between her house and [place].  She does use her rescue inhaler less than once per week. Sometimes she needs it in the shower. She is heavier now around 20 pounds which might be affecting all of this. She has not needed prednisone since we saw her and she has not been to the hospital for her breathing. She thinks that she is better with the Symbicort than without  it. She does not have any nighttime coughing.   Allergic Rhinitis Symptom History: She recently got antibiotics for a sinus infection in November. She is still blowing her nose and her nose is running a lot. She does have some nausea and her ear is making a ringing/pulsing noise.  They did get a dog in April. Her daughter has a sinus infection and Strep. She did get treated for that.   She is rather frustrated right now.  This is a hard time of the year for her because she has a 41 year old and 41 year old.  It seems that her 2 jobs are not making ends meet.  She is stressed about finances today.  Otherwise, there have been no changes to her past medical history, surgical history, family history, or social history.    Review of Systems  Constitutional: Negative.  Negative for chills, fever, malaise/fatigue and weight loss.  HENT:  Positive for congestion. Negative for ear discharge, ear pain and sinus pain.   Eyes:  Negative for pain, discharge and redness.  Respiratory:  Positive for cough, shortness of breath and wheezing. Negative for sputum production.   Cardiovascular: Negative.  Negative for chest pain and palpitations.  Gastrointestinal:  Negative for abdominal pain, constipation, diarrhea, heartburn, nausea and vomiting.  Skin: Negative.  Negative for itching and rash.  Neurological:  Negative for dizziness and headaches.  Endo/Heme/Allergies:  Negative for environmental allergies. Does not bruise/bleed easily.       Objective:   Blood pressure (!) 160/80, pulse (!) 108, temperature 98 F (36.7 C), temperature source Temporal, resp. rate 16, height 5' 3.25" (1.607 m), weight (!) 303 lb 6.4 oz (137.6 kg), SpO2 97 %. Body mass index is 53.32 kg/m.    Physical Exam Vitals reviewed.  Constitutional:      Appearance: She is well-developed.  HENT:     Head: Normocephalic and atraumatic.     Right Ear: Tympanic membrane, ear canal and external ear normal.     Left Ear:  Tympanic membrane, ear canal and external ear normal.     Nose: No nasal deformity, septal deviation, mucosal edema or rhinorrhea.     Right Turbinates: Enlarged, swollen and pale.     Left Turbinates: Enlarged, swollen and pale.     Right Sinus: No maxillary sinus tenderness or frontal sinus tenderness.     Left Sinus: No maxillary sinus tenderness or frontal sinus tenderness.     Mouth/Throat:     Lips: Pink.     Mouth: Mucous membranes are moist. Mucous membranes are not pale and not dry.     Pharynx: Uvula midline.     Comments: Cobblestoning present in the posterior oropharynx. Eyes:     General:        Right eye: No discharge.        Left eye: No discharge.     Conjunctiva/sclera: Conjunctivae normal.     Right eye: Right conjunctiva is not injected. No chemosis.  Left eye: Left conjunctiva is not injected. No chemosis.    Pupils: Pupils are equal, round, and reactive to light.  Cardiovascular:     Rate and Rhythm: Normal rate and regular rhythm.     Heart sounds: Normal heart sounds.  Pulmonary:     Effort: Pulmonary effort is normal. No tachypnea, accessory muscle usage or respiratory distress.     Breath sounds: Examination of the right-upper field reveals wheezing. Examination of the left-upper field reveals wheezing. Wheezing present. No rhonchi or rales.     Comments: Somewhat decreased air movement at the bases.  This may be due to body habitus. Chest:     Chest wall: No tenderness.  Lymphadenopathy:     Cervical: No cervical adenopathy.  Skin:    General: Skin is warm.     Capillary Refill: Capillary refill takes less than 2 seconds.     Coloration: Skin is not pale.     Findings: No abrasion, erythema, petechiae or rash. Rash is not papular, urticarial or vesicular.     Comments: No eczematous or urticarial lesions noted.  She does have some healing sores on her abdomen.  Neurological:     Mental Status: She is alert.  Psychiatric:        Behavior: Behavior is  cooperative.      Diagnostic studies:    Spirometry: results abnormal (FEV1: 1.01/35%, FVC: 2.47/70%, FEV1/FVC: 41%).    Spirometry consistent with severe obstructive disease. Xopenex nebulizer treatment given in clinic with significant improvement in FEV1 per ATS criteria.  Her FEV1 improved 108%.  It is unclear whether this was all related to effort or not.  Allergy Studies: none       Malachi Bonds, MD  [facility]

## 2022-06-13 NOTE — Patient Instructions (Addendum)
1. Cough variant asthma with acute exacerbation - Lung testing looked worse today but it did improve markedly with the Xopenex nebulizer treatment. - We are starting a prednisone burst today to get your breathing under better control. - We are going to look into starting an injectable asthma medication, but we need blood work before this is done.  - Do NOT start the prednisone until you get these labs done!  - Daily controller medication(s): Flovent 2 puffs twice daily with spacer - Prior to physical activity: albuterol 2 puffs 10-15 minutes before physical activity. - Rescue medications: albuterol 4 puffs every 4-6 hours as needed - Asthma control goals:  * Full participation in all desired activities (may need albuterol before activity) * Albuterol use two time or less a week on average (not counting use with activity) * Cough interfering with sleep two time or less a month * Oral steroids no more than once a year * No hospitalizations  2. Chronic rhinitis (indoor molds, outdoor molds and dust mites) - I sent in your medications (sorry that was not done last time).  - Continue taking: Zyrtec (cetirizine) 10mg  tablet once daily and Flonase (fluticasone) one spray per nostril daily - You can use an extra dose of the antihistamine, if needed, for breakthrough symptoms.  - Consider nasal saline rinses 1-2 times daily to remove allergens from the nasal cavities as well as help with mucous clearance (this is especially helpful to do before the nasal sprays are given)  3. Return in about 3 months (around 09/12/2022).    Please inform 09/14/2022 of any Emergency Department visits, hospitalizations, or changes in symptoms. Call us before going to the ED for breathing or allergy symptoms since we might be able to fit you in for a sick visit. Feel free to contact us anytime with any questions, problems, or concerns.  It was a pleasure to see you again today!  Websites that have reliable patient  information: 1. American Academy of Asthma, Allergy, and Immunology: www.aaaai.org 2. Food Allergy Research and Education (FARE): foodallergy.org 3. Mothers of Asthmatics: http://www.asthmacommunitynetwork.org 4. American College of Allergy, Asthma, and Immunology: www.acaai.org   COVID-19 Vaccine Information can be found at: [place] For questions related to vaccine distribution or appointments, please email [email or web address] or call [phone].   We realize that you might be concerned about having an allergic reaction to the COVID19 vaccines. To help with that concern, WE ARE OFFERING THE COVID19 VACCINES IN OUR OFFICE! Ask the front desk for dates!     "Like" [phone] on Facebook and Instagram for our latest updates!      A healthy democracy works best when [place] participate! Make sure you are registered to vote! If you have moved or changed any of your contact information, you will need to get this updated before voting!  In some cases, you MAY be able to register to vote online: [employer]

## 2022-07-22 ENCOUNTER — Other Ambulatory Visit: Payer: Self-pay | Admitting: Internal Medicine

## 2022-08-19 LAB — CBC WITH DIFFERENTIAL/PLATELET
Basophils Absolute: 0 10*3/uL (ref 0.0–0.2)
Basos: 0 %
EOS (ABSOLUTE): 0.1 10*3/uL (ref 0.0–0.4)
Eos: 2 %
Hematocrit: 42 % (ref 34.0–46.6)
Hemoglobin: 13.6 g/dL (ref 11.1–15.9)
Immature Grans (Abs): 0 10*3/uL (ref 0.0–0.1)
Immature Granulocytes: 0 %
Lymphocytes Absolute: 1.8 10*3/uL (ref 0.7–3.1)
Lymphs: 22 %
MCH: 29.8 pg (ref 26.6–33.0)
MCHC: 32.4 g/dL (ref 31.5–35.7)
MCV: 92 fL (ref 79–97)
Monocytes Absolute: 0.6 10*3/uL (ref 0.1–0.9)
Monocytes: 7 %
Neutrophils Absolute: 5.3 10*3/uL (ref 1.4–7.0)
Neutrophils: 69 %
Platelets: 313 10*3/uL (ref 150–450)
RBC: 4.57 x10E6/uL (ref 3.77–5.28)
RDW: 13.1 % (ref 11.7–15.4)
WBC: 7.8 10*3/uL (ref 3.4–10.8)

## 2022-08-19 LAB — IGE: IgE (Immunoglobulin E), Serum: 8 IU/mL (ref 6–495)

## 2022-09-09 NOTE — Progress Notes (Signed)
[address] [place] [address] Dept: [phone]  FOLLOW UP NOTE  Patient ID: Shelley Richardson, female    DOB: [birth year]  Age: 42 y.o. MRN: [id number] Date of Office Visit: 09/10/2022  Assessment  Chief Complaint: Follow-up (Eczema comes and goes and pt states her asthma feels like there is a lot of weight on her chest.)  HPI Shelley Richardson is a 42 year old female who presents to the clinic for follow-up visit.  She was last seen in this clinic on 06/13/2022 by Dr. Ernst Bowler for evaluation of asthma with exacerbation requiring prednisone for resolution, COVID long-haul, and allergic rhinitis.    At today's visit, she reports her asthma has been moderately well-controlled with occasional wheeze and occasional chest tightness or chest heaviness.  She denies shortness of breath or cough with activity.  She continues Symbicort 160-2 puffs once a day and occasionally uses Symbicort 162 puffs a second time during the day.  Allergic rhinitis is reported as poorly controlled with symptoms including clear rhinorrhea, nasal congestion, sneezing, and postnasal drainage.  She continues cetirizine daily, however, she reports she is out of this medication for several days, Flonase as needed, and novage saline rinsing system as needed with only mild relief of symptoms. Her last environmental allergy skin testing was on 07/18/2020 was positive to mold and dust mite.  Reflux is reported as poorly controlled with heartburn and chest heaviness as the main symptom.  She continues famotidine 20 mg once a day and pantoprazole 40 mg once a day.  She reports an interest in testing for the top most allergenic foods as she frequently experiences diarrhea.  She denies concomitant cardiopulmonary and integumentary symptoms with this diarrhea.  She is unable to identify anyone food that is the cause of diarrhea.  Her current medications are listed in the chart.  Drug Allergies:   No Known Allergies  Physical Exam: BP 138/80   Pulse 82   Temp 98 F (36.7 C)   Resp 20   Ht 5\' 3"  (1.6 m)   Wt (!) 322 lb (146.1 kg)   SpO2 95%   BMI 57.04 kg/m    Physical Exam Vitals reviewed.  Constitutional:      Appearance: Normal appearance.  HENT:     Head: Normocephalic and atraumatic.     Right Ear: Tympanic membrane normal.     Left Ear: Tympanic membrane normal.     Nose:     Comments: Bilateral nares slightly erythematous with clear nasal drainage noted.  Pharynx normal.  Ears normal.  Eyes normal.    Mouth/Throat:     Pharynx: Oropharynx is clear.  Eyes:     Conjunctiva/sclera: Conjunctivae normal.  Cardiovascular:     Rate and Rhythm: Normal rate and regular rhythm.     Heart sounds: Normal heart sounds. No murmur heard. Pulmonary:     Effort: Pulmonary effort is normal.     Breath sounds: Normal breath sounds.     Comments: Lungs clear to auscultation Musculoskeletal:        General: Normal range of motion.     Cervical back: Normal range of motion and neck supple.  Skin:    General: Skin is warm and dry.  Neurological:     Mental Status: She is alert and oriented to person, place, and time.  Psychiatric:        Mood and Affect: Mood normal.        Behavior: Behavior normal.  Thought Content: Thought content normal.        Judgment: Judgment normal.     Diagnostics: FVC 2.04, FEV1 1.54.  Predicted FVC 3.52, predicted FEV1 2.88.  Spirometry indicates restriction.  Postbronchodilator spirometry FVC 2.08, FEV1 1.45.  Postbronchodilator spirometry indicates no significant improvement in FEV1.  Assessment and Plan: 1. Not well controlled moderate persistent asthma   2. Seasonal and perennial allergic rhinitis   3. Seasonal allergic conjunctivitis   4. Gastroesophageal reflux disease, unspecified whether esophagitis present   5. Insect bite, unspecified site, subsequent encounter   6. Food sensitivity with gastrointestinal symptoms      Meds ordered this encounter  Medications   levocetirizine (XYZAL) 5 MG tablet    Sig: Take 1 tablet (5 mg total) by mouth every evening.    Dispense:  30 tablet    Refill:  5    Patient Instructions  Asthma Increase Symbicort 160 to 2 puffs twice a day to prevent cough or wheeze Continue albuterol 2 puffs once every 4 hours as needed for cough or wheeze You may use albuterol 2 puffs 5 to 15 minutes before activity to decrease cough or wheeze  Allergic rhinitis Continue allergen avoidance measures directed toward mold and dust mite as listed below Begin levocetirizine 5 mg once a day as needed for runny nose or itch Continue Flonase 2 sprays in each nostril once a day as needed for stuffy nose Consider saline nasal rinses as needed for nasal symptoms. Use this before any medicated nasal sprays for best result  Allergic conjunctivitis Begin olopatadine eye drops one drop in each eye once a day as needed for red or itchy eyes  Reflux Continue dietary and lifestyle modifications as listed below Continue pantoprazole 40 mg once a day to control reflux Increase famotidine 20 mg to twice a day to control reflux  Food allergy  Your food allergy skin testing was negative at today's visit. You do not need to avoid an foods.  Insect bites Apply ice to affected areas Continue cetirizine 10 mg as needed for itch. You may take an additional cetirizine once a day as needed for breakthrough symptoms Apply triamcinolone 0.1% ointment to red and itchy areas under your face up to twice a day as needed  Your blood pressure was elevated at today's visit. Follow up with your primary care provider for evaluation and management of your blood pressure  Call the clinic if this treatment plan is not working well for you.  Follow up in the clinic in 2 months or sooner if needed.   Return in about 2 months (around 11/10/2022), or if symptoms worsen or fail to improve.    Thank you for the  opportunity to care for this patient.  Please do not hesitate to contact me with questions.  Gareth Morgan, FNP [facility]

## 2022-09-09 NOTE — Patient Instructions (Incomplete)
Asthma Increase Symbicort 160 to 2 puffs twice a day with a spacer to prevent cough or wheeze Continue albuterol 2 puffs once every 4 hours as needed for cough or wheeze You may use albuterol 2 puffs 5 to 15 minutes before activity to decrease cough or wheeze  Allergic rhinitis Continue allergen avoidance measures directed toward mold and dust mite as listed below Begin levocetirizine 5 mg once a day as needed for runny nose or itch Continue Flonase 2 sprays in each nostril once a day as needed for stuffy nose Consider saline nasal rinses as needed for nasal symptoms. Use this before any medicated nasal sprays for best result  Allergic conjunctivitis Begin olopatadine eye drops one drop in each eye once a day as needed for red or itchy eyes  Reflux Continue dietary and lifestyle modifications as listed below Restart pantoprazole 40 mg once a day to control reflux Increase famotidine 20 mg to twice a day to control reflux  Food allergy  Your food allergy skin testing was negative at today's visit. You do not need to avoid an foods.  Insect bites Apply ice to affected areas Continue cetirizine 10 mg as needed for itch. You may take an additional cetirizine once a day as needed for breakthrough symptoms Apply triamcinolone 0.1% ointment to red and itchy areas under your face up to twice a day as needed  Call the clinic if this treatment plan is not working well for you.  Follow up in the clinic in 2 months or sooner if needed.   Lifestyle Changes for Controlling GERD When you have GERD, stomach acid feels as if it's backing up toward your mouth. Whether or not you take medication to control your GERD, your symptoms can often be improved with lifestyle changes.   Raise Your Head Reflux is more likely to strike when you're lying down flat, because stomach fluid can flow backward more easily. Raising the head of your bed 4-6 inches can help. To do this: Slide blocks or books under  the legs at the head of your bed. Or, place a wedge under the mattress. Many foam stores can make a suitable wedge for you. The wedge should run from your waist to the top of your head. Don't just prop your head on several pillows. This increases pressure on your stomach. It can make GERD worse.  Watch Your Eating Habits Certain foods may increase the acid in your stomach or relax the lower esophageal sphincter, making GERD more likely. It's best to avoid the following: Coffee, tea, and carbonated drinks (with and without caffeine) Fatty, fried, or spicy food Mint, chocolate, onions, and tomatoes Any other foods that seem to irritate your stomach or cause you pain  Relieve the Pressure Eat smaller meals, even if you have to eat more often. Don't lie down right after you eat. Wait a few hours for your stomach to empty. Avoid tight belts and tight-fitting clothes. Lose excess weight.  Tobacco and Alcohol Avoid smoking tobacco and drinking alcohol. They can make GERD symptoms worse.  Control of Mold Allergen Mold and fungi can grow on a variety of surfaces provided certain temperature and moisture conditions exist.  Outdoor molds grow on plants, decaying vegetation and soil.  The major outdoor mold, Alternaria and Cladosporium, are found in very high numbers during hot and dry conditions.  Generally, a late Summer - Fall peak is seen for common outdoor fungal spores.  Rain will temporarily lower outdoor mold spore count, but counts  rise rapidly when the rainy period ends.  The most important indoor molds are Aspergillus and Penicillium.  Dark, humid and poorly ventilated basements are ideal sites for mold growth.  The next most common sites of mold growth are the bathroom and the kitchen.  Outdoor [employer] Use air conditioning and keep windows closed Avoid exposure to decaying vegetation. Avoid leaf raking. Avoid grain handling. Consider wearing a face mask if working in moldy  areas.  Indoor Mold Control Maintain humidity below 50%. Clean washable surfaces with 5% bleach solution. Remove sources e.g. Contaminated carpets.   Control of Dust Mite Allergen Dust mites play a major role in allergic asthma and rhinitis. They occur in environments with high humidity wherever human skin is found. Dust mites absorb humidity from the atmosphere (ie, they do not drink) and feed on organic matter (including shed human and animal skin). Dust mites are a microscopic type of insect that you cannot see with the naked eye. High levels of dust mites have been detected from mattresses, pillows, carpets, upholstered furniture, bed covers, clothes, soft toys and any woven material. The principal allergen of the dust mite is found in its feces. A gram of dust may contain 1,000 mites and 250,000 fecal particles. Mite antigen is easily measured in the air during house cleaning activities. Dust mites do not bite and do not cause harm to humans, other than by triggering allergies/asthma.  Ways to decrease your exposure to dust mites in your home:  1. Encase mattresses, box springs and pillows with a mite-impermeable barrier or cover  2. Wash sheets, blankets and drapes weekly in hot water (130 F) with detergent and dry them in a dryer on the hot setting.  3. Have the room cleaned frequently with a vacuum cleaner and a damp dust-mop. For carpeting or rugs, vacuuming with a vacuum cleaner equipped with a high-efficiency particulate air (HEPA) filter. The dust mite allergic individual should not be in a room which is being cleaned and should wait 1 hour after cleaning before going into the room.  4. Do not sleep on upholstered furniture (eg, couches).  5. If possible removing carpeting, upholstered furniture and drapery from the home is ideal. Horizontal blinds should be eliminated in the rooms where the person spends the most time (bedroom, study, television room). Washable vinyl, roller-type  shades are optimal.  6. Remove all non-washable stuffed toys from the bedroom. Wash stuffed toys weekly like sheets and blankets above.  7. Reduce indoor humidity to less than 50%. Inexpensive humidity monitors can be purchased at most hardware stores. Do not use a humidifier as can make the problem worse and are not recommended.   Strategies for Safer Mosquito Avoidance  by [place] are a terrible nuisance in the muggy summer months, especially now that the ferocious Asian tiger mosquito has made a permanent home here in [place]. The arrival of [place] virus has added some urgency to mosquito control measures, but spray programs and many repellents may do more harm than good in the long term. Choosing the least-toxic solutions can protect both your health and comfort in mosquito season. Here are some suggestions for safer and more effective bite avoidance this summer.   Population Control  Keeping mosquito populations in check is the most important way to avoid bites. It's no secret that removing sources of standing water is crucial to eliminating mosquito breeding grounds. Common breeding sites to watch for include:  * Rain gutters. Clean  them out and offer to do the same for elderly neighbors or others who may not be able to do the job themselves. Remember that mosquito control is a community-wide effort.  * Flowerpots, buckets and old tires. Be sure empty containers cannot hold water.  * Bird baths and pet dishes. Empty and clean them weekly.  * Recycling bins and the cans inside. These may harbor stagnant water if not emptied regularly.  * Rain barrels. Be sure they are sealed off from mosquitoes.  * Storm drains. Watch for clogs from branches and garbage.  Insecticide sprays targeting adult mosquitoes can only reduce mosquito populations for a day or two. In fact, since insecticides also kill off important mosquito predators such as dragonflies, a spray program can  actually be counter-productive by leaving the rebounding mosquito population without natural enemies.  Instead, interrupt the breeding cycle by using the nontoxic bacterial larvicide Bacillus thuringiensis var. israelensis (Bti). Bti is sold in convenient donuts called "mosquito dunks" that you can safely use in your bird bath, rain barrel or low areas around your yard to kill mosquito larvae before the adults emerge and spread throughout the community, where they become much harder to kill. Bti is not harmful to fish, birds or mammals, and single applications can remain effective for a month or more, even if the water source dries out and refills.   Safer Repellents  If you'll be outdoors at dawn or dusk when mosquitoes are most active, wear long clothes that don't leave skin exposed. (You may use insect repellent on your clothes). When you do get bites, soothe them by slathering on an astringent such as witch hazel after you come inside -- it will prevent scratching and allow bites to heal quickly.  Lately many public health officials concerned about [place] virus have been advising people to use repellents containing the pesticide DEET (N,N-diethyl-meta-toluamide). While DEET is an extremely effective mosquito repellent, it is also a neurotoxin, and studies have shown that prolonged frequent exposure can irritate skin, cause muscle twitching and weakness and harm the brain and nervous system, especially when combined with other pesticides such as permethrin.  Consumer studies report that [employer]'s Skin-So-Soft and herbal repellents containing citronella can be just as effective as DEET at repelling mosquitoes but need to be applied more often. The solution is to choose the safer formulas and reapply as needed.  General guidelines for using any insect repellent:  * Choose oils or lotions rather than sprays, which produce fine particles that are easily inhaled.  * Do not apply repellents to broken skin.  *  Do not allow children to apply their own repellent, and do not apply repellents containing DEET or other pesticides directly to children's skin. If you use such products, they can be applied to children's clothing instead.  * Do not use sunscreen/repellent combinations. Sunscreen needs to be reapplied more often than repellents, so the combination products can result in overexposure to pesticides.  * Wash off all repellent from skin and clothing immediately after coming indoors.  Area-wide repellent strategies can also be effective for outdoor gatherings. There are various contraptions available that emit carbon dioxide to trap mosquitoes (such as the Mosquito Magnet and Mosquito Deleto). These are expensive, but they do work, and some companies will even rent them to you for an outdoor event. Citronella candles are also effective when there is no breeze, but beware of candles containing pesticides -- the smoke is easily inhaled and can irritate the airway.  Placing fans around your porch or patio can blow mosquitoes away.  Keep in mind that only female mosquitoes actually bite and that most mosquito species in this area do not transmit West Nile virus. You are most at risk of being bitten by a mosquito carrying the disease at dawn and dusk, and even in these cases your chances of actually contracting the virus are extremely low. So take sensible steps to keep the buggers under control, but also keep them in perspective as the annoyances they are.

## 2022-09-10 ENCOUNTER — Encounter: Payer: Self-pay | Admitting: Family Medicine

## 2022-09-10 ENCOUNTER — Other Ambulatory Visit: Payer: Self-pay

## 2022-09-10 ENCOUNTER — Ambulatory Visit: Payer: Medicaid Other | Admitting: Family Medicine

## 2022-09-10 VITALS — BP 138/80 | HR 82 | Temp 98.0°F | Resp 20 | Ht 63.0 in | Wt 322.0 lb

## 2022-09-10 DIAGNOSIS — W57XXXD Bitten or stung by nonvenomous insect and other nonvenomous arthropods, subsequent encounter: Secondary | ICD-10-CM

## 2022-09-10 DIAGNOSIS — J3089 Other allergic rhinitis: Secondary | ICD-10-CM

## 2022-09-10 DIAGNOSIS — J4540 Moderate persistent asthma, uncomplicated: Secondary | ICD-10-CM | POA: Diagnosis not present

## 2022-09-10 DIAGNOSIS — H1010 Acute atopic conjunctivitis, unspecified eye: Secondary | ICD-10-CM

## 2022-09-10 DIAGNOSIS — K219 Gastro-esophageal reflux disease without esophagitis: Secondary | ICD-10-CM

## 2022-09-10 DIAGNOSIS — T781XXA Other adverse food reactions, not elsewhere classified, initial encounter: Secondary | ICD-10-CM | POA: Diagnosis not present

## 2022-09-10 MED ORDER — LEVOCETIRIZINE DIHYDROCHLORIDE 5 MG PO TABS: 5.0000 mg | ORAL_TABLET | Freq: Every evening | ORAL | 5 refills | Status: DC

## 2022-09-10 NOTE — Addendum Note (Signed)
Addended by: Clovis Cao A on: 09/10/2022 03:27 PM   Modules accepted: Orders

## 2022-09-18 ENCOUNTER — Other Ambulatory Visit: Payer: Self-pay | Admitting: Internal Medicine

## 2022-11-11 NOTE — Progress Notes (Signed)
   [address] [place] [address] Dept: [phone]  FOLLOW UP NOTE  Patient ID: Feliberto Gottron, female    DOB: Nov 14, 1980  Age: 42 y.o. MRN: [id number] Date of Office Visit: 11/12/2022  Assessment  Chief Complaint: No chief complaint on file.  HPI Madilee Gronau is a 42 year old female who presents to the clinic for follow-up visit.  She was last seen in this clinic on 09/10/2022 by Thermon Leyland, FNP, for evaluation of asthma, allergic rhinitis, allergic conjunctivitis, reflux, food sensitivity, and local swelling with mosquito bites.  Chart review indicates that she has been in the emergency department with hospital admission for acute pancreatitis.   Drug Allergies:  No Known Allergies  Physical Exam: There were no vitals taken for this visit.   Physical Exam  Diagnostics:    Assessment and Plan: No diagnosis found.  No orders of the defined types were placed in this encounter.   There are no Patient Instructions on file for this visit.  No follow-ups on file.    Thank you for the opportunity to care for this patient.  Please do not hesitate to contact me with questions.  Thermon Leyland, FNP [facility]

## 2022-11-11 NOTE — Patient Instructions (Incomplete)
Asthma Increase Symbicort 160 to 2 puffs twice a day with a spacer to prevent cough or wheeze Continue albuterol 2 puffs once every 4 hours as needed for cough or wheeze You may use albuterol 2 puffs 5 to 15 minutes before activity to decrease cough or wheeze  Allergic rhinitis Continue allergen avoidance measures directed toward mold and dust mite as listed below Begin levocetirizine 5 mg once a day as needed for runny nose or itch Continue Flonase 2 sprays in each nostril once a day as needed for stuffy nose Consider saline nasal rinses as needed for nasal symptoms. Use this before any medicated nasal sprays for best result  Allergic conjunctivitis Begin olopatadine eye drops one drop in each eye once a day as needed for red or itchy eyes  Reflux Continue dietary and lifestyle modifications as listed below Restart pantoprazole 40 mg once a day to control reflux Increase famotidine 20 mg to twice a day to control reflux  Food allergy  Your food allergy skin testing was negative at today's visit. You do not need to avoid an foods.  Insect bites Apply ice to affected areas Continue cetirizine 10 mg as needed for itch. You may take an additional cetirizine once a day as needed for breakthrough symptoms Apply triamcinolone 0.1% ointment to red and itchy areas under your face up to twice a day as needed  Call the clinic if this treatment plan is not working well for you.  Follow up in the clinic in 2 months or sooner if needed.   Lifestyle Changes for Controlling GERD When you have GERD, stomach acid feels as if it's backing up toward your mouth. Whether or not you take medication to control your GERD, your symptoms can often be improved with lifestyle changes.   Raise Your Head Reflux is more likely to strike when you're lying down flat, because stomach fluid can flow backward more easily. Raising the head of your bed 4-6 inches can help. To do this: Slide blocks or books under  the legs at the head of your bed. Or, place a wedge under the mattress. Many foam stores can make a suitable wedge for you. The wedge should run from your waist to the top of your head. Don't just prop your head on several pillows. This increases pressure on your stomach. It can make GERD worse.  Watch Your Eating Habits Certain foods may increase the acid in your stomach or relax the lower esophageal sphincter, making GERD more likely. It's best to avoid the following: Coffee, tea, and carbonated drinks (with and without caffeine) Fatty, fried, or spicy food Mint, chocolate, onions, and tomatoes Any other foods that seem to irritate your stomach or cause you pain  Relieve the Pressure Eat smaller meals, even if you have to eat more often. Don't lie down right after you eat. Wait a few hours for your stomach to empty. Avoid tight belts and tight-fitting clothes. Lose excess weight.  Tobacco and Alcohol Avoid smoking tobacco and drinking alcohol. They can make GERD symptoms worse.  Control of Mold Allergen Mold and fungi can grow on a variety of surfaces provided certain temperature and moisture conditions exist.  Outdoor molds grow on plants, decaying vegetation and soil.  The major outdoor mold, Alternaria and Cladosporium, are found in very high numbers during hot and dry conditions.  Generally, a late Summer - Fall peak is seen for common outdoor fungal spores.  Rain will temporarily lower outdoor mold spore count, but counts  rise rapidly when the rainy period ends.  The most important indoor molds are Aspergillus and Penicillium.  Dark, humid and poorly ventilated basements are ideal sites for mold growth.  The next most common sites of mold growth are the bathroom and the kitchen.  Outdoor [employer] Use air conditioning and keep windows closed Avoid exposure to decaying vegetation. Avoid leaf raking. Avoid grain handling. Consider wearing a face mask if working in moldy  areas.  Indoor Mold Control Maintain humidity below 50%. Clean washable surfaces with 5% bleach solution. Remove sources e.g. Contaminated carpets.   Control of Dust Mite Allergen Dust mites play a major role in allergic asthma and rhinitis. They occur in environments with high humidity wherever human skin is found. Dust mites absorb humidity from the atmosphere (ie, they do not drink) and feed on organic matter (including shed human and animal skin). Dust mites are a microscopic type of insect that you cannot see with the naked eye. High levels of dust mites have been detected from mattresses, pillows, carpets, upholstered furniture, bed covers, clothes, soft toys and any woven material. The principal allergen of the dust mite is found in its feces. A gram of dust may contain 1,000 mites and 250,000 fecal particles. Mite antigen is easily measured in the air during house cleaning activities. Dust mites do not bite and do not cause harm to humans, other than by triggering allergies/asthma.  Ways to decrease your exposure to dust mites in your home:  1. Encase mattresses, box springs and pillows with a mite-impermeable barrier or cover  2. Wash sheets, blankets and drapes weekly in hot water (130 F) with detergent and dry them in a dryer on the hot setting.  3. Have the room cleaned frequently with a vacuum cleaner and a damp dust-mop. For carpeting or rugs, vacuuming with a vacuum cleaner equipped with a high-efficiency particulate air (HEPA) filter. The dust mite allergic individual should not be in a room which is being cleaned and should wait 1 hour after cleaning before going into the room.  4. Do not sleep on upholstered furniture (eg, couches).  5. If possible removing carpeting, upholstered furniture and drapery from the home is ideal. Horizontal blinds should be eliminated in the rooms where the person spends the most time (bedroom, study, television room). Washable vinyl, roller-type  shades are optimal.  6. Remove all non-washable stuffed toys from the bedroom. Wash stuffed toys weekly like sheets and blankets above.  7. Reduce indoor humidity to less than 50%. Inexpensive humidity monitors can be purchased at most hardware stores. Do not use a humidifier as can make the problem worse and are not recommended.   Strategies for Safer Mosquito Avoidance  by [employer]   Mosquitoes are a terrible nuisance in the muggy summer months, especially now that the ferocious Asian tiger mosquito has made a permanent home here in [place]. The arrival of [place] Nile virus has added some urgency to mosquito control measures, but spray programs and many repellents may do more harm than good in the long term. Choosing the least-toxic solutions can protect both your health and comfort in mosquito season. Here are some suggestions for safer and more effective bite avoidance this summer.   Population Control  Keeping mosquito populations in check is the most important way to avoid bites. It's no secret that removing sources of standing water is crucial to eliminating mosquito breeding grounds. Common breeding sites to watch for include:  * Rain gutters. Clean  them out and offer to do the same for elderly neighbors or others who may not be able to do the job themselves. Remember that mosquito control is a community-wide effort.  * Flowerpots, buckets and old tires. Be sure empty containers cannot hold water.  * Bird baths and pet dishes. Empty and clean them weekly.  * Recycling bins and the cans inside. These may harbor stagnant water if not emptied regularly.  * Rain barrels. Be sure they are sealed off from mosquitoes.  * Storm drains. Watch for clogs from branches and garbage.  Insecticide sprays targeting adult mosquitoes can only reduce mosquito populations for a day or two. In fact, since insecticides also kill off important mosquito predators such as dragonflies, a spray program can  actually be counter-productive by leaving the rebounding mosquito population without natural enemies.  Instead, interrupt the breeding cycle by using the nontoxic bacterial larvicide Bacillus thuringiensis var. israelensis (Bti). Bti is sold in convenient donuts called "mosquito dunks" that you can safely use in your bird bath, rain barrel or low areas around your yard to kill mosquito larvae before the adults emerge and spread throughout the community, where they become much harder to kill. Bti is not harmful to fish, birds or mammals, and single applications can remain effective for a month or more, even if the water source dries out and refills.   Safer Repellents  If you'll be outdoors at dawn or dusk when mosquitoes are most active, wear long clothes that don't leave skin exposed. (You may use insect repellent on your clothes). When you do get bites, soothe them by slathering on an astringent such as witch hazel after you come inside -- it will prevent scratching and allow bites to heal quickly.  Lately many public health officials concerned about [place] Nile virus have been advising people to use repellents containing the pesticide DEET (N,N-diethyl-meta-toluamide). While DEET is an extremely effective mosquito repellent, it is also a neurotoxin, and studies have shown that prolonged frequent exposure can irritate skin, cause muscle twitching and weakness and harm the brain and nervous system, especially when combined with other pesticides such as permethrin.  Consumer studies report that [employer] Skin-So-Soft and herbal repellents containing citronella can be just as effective as DEET at repelling mosquitoes but need to be applied more often. The solution is to choose the safer formulas and reapply as needed.  General guidelines for using any insect repellent:  * Choose oils or lotions rather than sprays, which produce fine particles that are easily inhaled.  * Do not apply repellents to broken skin.  *  Do not allow children to apply their own repellent, and do not apply repellents containing DEET or other pesticides directly to children's skin. If you use such products, they can be applied to children's clothing instead.  * Do not use sunscreen/repellent combinations. Sunscreen needs to be reapplied more often than repellents, so the combination products can result in overexposure to pesticides.  * Wash off all repellent from skin and clothing immediately after coming indoors.  Area-wide repellent strategies can also be effective for outdoor gatherings. There are various contraptions available that emit carbon dioxide to trap mosquitoes (such as the Mosquito Magnet and Mosquito Deleto). These are expensive, but they do work, and some companies will even rent them to you for an outdoor event. Citronella candles are also effective when there is no breeze, but beware of candles containing pesticides -- the smoke is easily inhaled and can irritate the airway.  Placing fans around your porch or patio can blow mosquitoes away.  Keep in mind that only female mosquitoes actually bite and that most mosquito species in this area do not transmit [place] Nile virus. You are most at risk of being bitten by a mosquito carrying the disease at dawn and dusk, and even in these cases your chances of actually contracting the virus are extremely low. So take sensible steps to keep the buggers under control, but also keep them in perspective as the annoyances they are.

## 2022-11-12 ENCOUNTER — Ambulatory Visit: Payer: Medicaid Other | Admitting: Family Medicine

## 2022-11-12 ENCOUNTER — Encounter: Payer: Self-pay | Admitting: Family Medicine

## 2022-11-12 VITALS — BP 148/98 | HR 110 | Temp 98.1°F | Resp 18 | Ht 63.5 in | Wt 326.0 lb

## 2022-11-12 DIAGNOSIS — J4540 Moderate persistent asthma, uncomplicated: Secondary | ICD-10-CM | POA: Diagnosis not present

## 2022-11-12 DIAGNOSIS — J3089 Other allergic rhinitis: Secondary | ICD-10-CM

## 2022-11-12 DIAGNOSIS — K219 Gastro-esophageal reflux disease without esophagitis: Secondary | ICD-10-CM

## 2022-11-12 DIAGNOSIS — J302 Other seasonal allergic rhinitis: Secondary | ICD-10-CM

## 2022-11-12 DIAGNOSIS — H1010 Acute atopic conjunctivitis, unspecified eye: Secondary | ICD-10-CM

## 2022-11-12 DIAGNOSIS — H1013 Acute atopic conjunctivitis, bilateral: Secondary | ICD-10-CM

## 2022-11-12 DIAGNOSIS — W57XXXD Bitten or stung by nonvenomous insect and other nonvenomous arthropods, subsequent encounter: Secondary | ICD-10-CM

## 2022-11-12 MED ORDER — ALBUTEROL SULFATE HFA 108 (90 BASE) MCG/ACT IN AERS: INHALATION_SPRAY | RESPIRATORY_TRACT | 1 refills | Status: DC

## 2022-11-12 MED ORDER — LEVOCETIRIZINE DIHYDROCHLORIDE 5 MG PO TABS
5.0000 mg | ORAL_TABLET | Freq: Every evening | ORAL | 5 refills | Status: DC
Start: 2022-11-12 — End: 2023-09-28

## 2022-11-12 MED ORDER — BUDESONIDE-FORMOTEROL FUMARATE 160-4.5 MCG/ACT IN AERO: INHALATION_SPRAY | RESPIRATORY_TRACT | 3 refills | Status: DC

## 2022-11-13 ENCOUNTER — Other Ambulatory Visit (HOSPITAL_COMMUNITY): Payer: Self-pay

## 2022-12-15 ENCOUNTER — Other Ambulatory Visit: Payer: Self-pay | Admitting: Allergy & Immunology

## 2023-01-10 ENCOUNTER — Other Ambulatory Visit: Payer: Self-pay | Admitting: Internal Medicine

## 2023-02-04 ENCOUNTER — Encounter: Payer: Self-pay | Admitting: Family Medicine

## 2023-02-04 ENCOUNTER — Ambulatory Visit: Payer: Medicaid Other | Admitting: Family Medicine

## 2023-02-04 VITALS — BP 128/84 | HR 96 | Temp 98.2°F | Resp 20 | Wt 346.4 lb

## 2023-02-04 DIAGNOSIS — K219 Gastro-esophageal reflux disease without esophagitis: Secondary | ICD-10-CM | POA: Diagnosis not present

## 2023-02-04 DIAGNOSIS — H1013 Acute atopic conjunctivitis, bilateral: Secondary | ICD-10-CM

## 2023-02-04 DIAGNOSIS — Z87828 Personal history of other (healed) physical injury and trauma: Secondary | ICD-10-CM

## 2023-02-04 DIAGNOSIS — J4540 Moderate persistent asthma, uncomplicated: Secondary | ICD-10-CM | POA: Diagnosis not present

## 2023-02-04 DIAGNOSIS — J302 Other seasonal allergic rhinitis: Secondary | ICD-10-CM

## 2023-02-04 DIAGNOSIS — J3089 Other allergic rhinitis: Secondary | ICD-10-CM | POA: Diagnosis not present

## 2023-02-04 DIAGNOSIS — H1010 Acute atopic conjunctivitis, unspecified eye: Secondary | ICD-10-CM

## 2023-02-04 MED ORDER — TRIAMCINOLONE ACETONIDE 0.1 % EX OINT: TOPICAL_OINTMENT | CUTANEOUS | 5 refills | Status: AC

## 2023-02-04 MED ORDER — BUDESONIDE-FORMOTEROL FUMARATE 160-4.5 MCG/ACT IN AERO: INHALATION_SPRAY | RESPIRATORY_TRACT | 3 refills | Status: DC

## 2023-02-04 MED ORDER — FAMOTIDINE 20 MG PO TABS: 20.0000 mg | ORAL_TABLET | Freq: Every day | ORAL | 5 refills | Status: DC

## 2023-02-04 MED ORDER — PANTOPRAZOLE SODIUM 40 MG PO TBEC: 40.0000 mg | DELAYED_RELEASE_TABLET | Freq: Every day | ORAL | 5 refills | Status: DC

## 2023-02-04 MED ORDER — FLUTICASONE PROPIONATE 50 MCG/ACT NA SUSP: 2.0000 | Freq: Every day | NASAL | 5 refills | Status: DC | PRN

## 2023-02-04 NOTE — Patient Instructions (Addendum)
Asthma Continue Symbicort 160 to 2 puffs twice a day every day with a spacer to prevent cough or wheeze Continue albuterol 2 puffs once every 4 hours as needed for cough or wheeze You may use albuterol 2 puffs 5 to 15 minutes before activity to decrease cough or wheeze Consider a biologic therapy to help control your asthma  Allergic rhinitis Continue allergen avoidance measures directed toward mold and dust mite as listed below Continue levocetirizine 5 mg once a day as needed for runny nose or itch. You may take cetirizine 10 mg once a day as needed for breakthrough symptoms if needed Continue Flonase 2 sprays in each nostril once a day as needed for stuffy nose Consider saline nasal rinses as needed for nasal symptoms. Use this before any medicated nasal sprays for best result  Allergic conjunctivitis Begin olopatadine eye drops one drop in each eye once a day as needed for red or itchy eyes  Reflux Continue dietary and lifestyle modifications as listed below Restart pantoprazole 40 mg once a day to control reflux Restart famotidine 20 mg  once a day to control reflux  Insect bites Apply ice to affected areas Continue cetirizine 10 mg as needed for itch. You may take an additional cetirizine once a day as needed for breakthrough symptoms Apply triamcinolone 0.1% ointment to red and itchy areas under your face up to twice a day as needed  Call the clinic if this treatment plan is not working well for you.  Follow up in the clinic in 3 months or sooner if needed.   Lifestyle Changes for Controlling GERD When you have GERD, stomach acid feels as if it's backing up toward your mouth. Whether or not you take medication to control your GERD, your symptoms can often be improved with lifestyle changes.   Raise Your Head Reflux is more likely to strike when you're lying down flat, because stomach fluid can flow backward more easily. Raising the head of your bed 4-6 inches can help. To  do this: Slide blocks or books under the legs at the head of your bed. Or, place a wedge under the mattress. Many foam stores can make a suitable wedge for you. The wedge should run from your waist to the top of your head. Don't just prop your head on several pillows. This increases pressure on your stomach. It can make GERD worse.  Watch Your Eating Habits Certain foods may increase the acid in your stomach or relax the lower esophageal sphincter, making GERD more likely. It's best to avoid the following: Coffee, tea, and carbonated drinks (with and without caffeine) Fatty, fried, or spicy food Mint, chocolate, onions, and tomatoes Any other foods that seem to irritate your stomach or cause you pain  Relieve the Pressure Eat smaller meals, even if you have to eat more often. Don't lie down right after you eat. Wait a few hours for your stomach to empty. Avoid tight belts and tight-fitting clothes. Lose excess weight.  Tobacco and Alcohol Avoid smoking tobacco and drinking alcohol. They can make GERD symptoms worse.  Control of Mold Allergen Mold and fungi can grow on a variety of surfaces provided certain temperature and moisture conditions exist.  Outdoor molds grow on plants, decaying vegetation and soil.  The major outdoor mold, Alternaria and Cladosporium, are found in very high numbers during hot and dry conditions.  Generally, a late Summer - Fall peak is seen for common outdoor fungal spores.  Rain will temporarily lower outdoor  mold spore count, but counts rise rapidly when the rainy period ends.  The most important indoor molds are Aspergillus and Penicillium.  Dark, humid and poorly ventilated basements are ideal sites for mold growth.  The next most common sites of mold growth are the bathroom and the kitchen.  Outdoor [employer] Use air conditioning and keep windows closed Avoid exposure to decaying vegetation. Avoid leaf raking. Avoid grain handling. Consider wearing a  face mask if working in moldy areas.  Indoor Mold Control Maintain humidity below 50%. Clean washable surfaces with 5% bleach solution. Remove sources e.g. Contaminated carpets.   Control of Dust Mite Allergen Dust mites play a major role in allergic asthma and rhinitis. They occur in environments with high humidity wherever human skin is found. Dust mites absorb humidity from the atmosphere (ie, they do not drink) and feed on organic matter (including shed human and animal skin). Dust mites are a microscopic type of insect that you cannot see with the naked eye. High levels of dust mites have been detected from mattresses, pillows, carpets, upholstered furniture, bed covers, clothes, soft toys and any woven material. The principal allergen of the dust mite is found in its feces. A gram of dust may contain 1,000 mites and 250,000 fecal particles. Mite antigen is easily measured in the air during house cleaning activities. Dust mites do not bite and do not cause harm to humans, other than by triggering allergies/asthma.  Ways to decrease your exposure to dust mites in your home:  1. Encase mattresses, box springs and pillows with a mite-impermeable barrier or cover  2. Wash sheets, blankets and drapes weekly in hot water (130 F) with detergent and dry them in a dryer on the hot setting.  3. Have the room cleaned frequently with a vacuum cleaner and a damp dust-mop. For carpeting or rugs, vacuuming with a vacuum cleaner equipped with a high-efficiency particulate air (HEPA) filter. The dust mite allergic individual should not be in a room which is being cleaned and should wait 1 hour after cleaning before going into the room.  4. Do not sleep on upholstered furniture (eg, couches).  5. If possible removing carpeting, upholstered furniture and drapery from the home is ideal. Horizontal blinds should be eliminated in the rooms where the person spends the most time (bedroom, study, television room).  Washable vinyl, roller-type shades are optimal.  6. Remove all non-washable stuffed toys from the bedroom. Wash stuffed toys weekly like sheets and blankets above.  7. Reduce indoor humidity to less than 50%. Inexpensive humidity monitors can be purchased at most hardware stores. Do not use a humidifier as can make the problem worse and are not recommended.   Strategies for Safer Mosquito Avoidance  by [employer]   Mosquitoes are a terrible nuisance in the muggy summer months, especially now that the ferocious Asian tiger mosquito has made a permanent home here in [place]. The arrival of [place] Nile virus has added some urgency to mosquito control measures, but spray programs and many repellents may do more harm than good in the long term. Choosing the least-toxic solutions can protect both your health and comfort in mosquito season. Here are some suggestions for safer and more effective bite avoidance this summer.   Population Control  Keeping mosquito populations in check is the most important way to avoid bites. It's no secret that removing sources of standing water is crucial to eliminating mosquito breeding grounds. Common breeding sites to watch for include:  *  Rain gutters. Clean them out and offer to do the same for elderly neighbors or others who may not be able to do the job themselves. Remember that mosquito control is a community-wide effort.  * Flowerpots, buckets and old tires. Be sure empty containers cannot hold water.  * Bird baths and pet dishes. Empty and clean them weekly.  * Recycling bins and the cans inside. These may harbor stagnant water if not emptied regularly.  * Rain barrels. Be sure they are sealed off from mosquitoes.  * Storm drains. Watch for clogs from branches and garbage.  Insecticide sprays targeting adult mosquitoes can only reduce mosquito populations for a day or two. In fact, since insecticides also kill off important mosquito predators such as  dragonflies, a spray program can actually be counter-productive by leaving the rebounding mosquito population without natural enemies.  Instead, interrupt the breeding cycle by using the nontoxic bacterial larvicide Bacillus thuringiensis var. israelensis (Bti). Bti is sold in convenient donuts called "mosquito dunks" that you can safely use in your bird bath, rain barrel or low areas around your yard to kill mosquito larvae before the adults emerge and spread throughout the community, where they become much harder to kill. Bti is not harmful to fish, birds or mammals, and single applications can remain effective for a month or more, even if the water source dries out and refills.   Safer Repellents  If you'll be outdoors at dawn or dusk when mosquitoes are most active, wear long clothes that don't leave skin exposed. (You may use insect repellent on your clothes). When you do get bites, soothe them by slathering on an astringent such as witch hazel after you come inside -- it will prevent scratching and allow bites to heal quickly.  Lately many public health officials concerned about [place] Nile virus have been advising people to use repellents containing the pesticide DEET (N,N-diethyl-meta-toluamide). While DEET is an extremely effective mosquito repellent, it is also a neurotoxin, and studies have shown that prolonged frequent exposure can irritate skin, cause muscle twitching and weakness and harm the brain and nervous system, especially when combined with other pesticides such as permethrin.  Consumer studies report that [employer] Skin-So-Soft and herbal repellents containing citronella can be just as effective as DEET at repelling mosquitoes but need to be applied more often. The solution is to choose the safer formulas and reapply as needed.  General guidelines for using any insect repellent:  * Choose oils or lotions rather than sprays, which produce fine particles that are easily inhaled.  * Do not  apply repellents to broken skin.  * Do not allow children to apply their own repellent, and do not apply repellents containing DEET or other pesticides directly to children's skin. If you use such products, they can be applied to children's clothing instead.  * Do not use sunscreen/repellent combinations. Sunscreen needs to be reapplied more often than repellents, so the combination products can result in overexposure to pesticides.  * Wash off all repellent from skin and clothing immediately after coming indoors.  Area-wide repellent strategies can also be effective for outdoor gatherings. There are various contraptions available that emit carbon dioxide to trap mosquitoes (such as the Mosquito Magnet and Mosquito Deleto). These are expensive, but they do work, and some companies will even rent them to you for an outdoor event. Citronella candles are also effective when there is no breeze, but beware of candles containing pesticides -- the smoke is easily inhaled and can  irritate the airway. Placing fans around your porch or patio can blow mosquitoes away.  Keep in mind that only female mosquitoes actually bite and that most mosquito species in this area do not transmit [place] Nile virus. You are most at risk of being bitten by a mosquito carrying the disease at dawn and dusk, and even in these cases your chances of actually contracting the virus are extremely low. So take sensible steps to keep the buggers under control, but also keep them in perspective as the annoyances they are.

## 2023-02-04 NOTE — Progress Notes (Signed)
[address] [place] [address] Dept: [phone]  FOLLOW UP NOTE  Patient ID: Shelley Richardson, female    DOB: Sep 19, 1980  Age: 42 y.o. MRN: [id number] Date of Office Visit: 02/04/2023  Assessment  Chief Complaint: Follow-up (Had an asthma flare last week. Was having SOB) and Rash (Rash on both arms that started to itch. Itchy throat- animal dander has been bothering her. )  HPI Shelley Richardson is a 42 year old female who presents to the clinic for follow-up visit.  She was last seen in this clinic on 11/12/2022 by Thermon Leyland, FNP, for evaluation o asthma, allergic rhinitis, allergic conjunctivitis, reflux, and large local reaction to insect bites.    At today's visit, she reports her asthma has been moderately well-controlled with symptoms including shortness of breath with activity and rest, slight intermittent wheeze, and cough producing yellow to green mucus.  She continues Symbicort 162 puffs twice a day on most days.  She does report that she forgets occasionally.  She is currently out of Symbicort.  She uses albuterol 1-2 times a week with relief of symptoms.    Allergic rhinitis is reported as moderately well-controlled with symptoms including clear rhinorrhea, nasal congestion, sneezing, and postnasal drainage.  She reports that recently she has experienced itching in her nose.  She continues levocetirizine 10 mg once a day and has taken some cetirizine for breakthrough symptoms with moderate relief as symptoms.  She continues Flonase as needed and is not currently using any saline nasal spray.  Her last environmental allergy testing was 07/18/2020 on it was positive to mold and dust mite.  Reflux is reported as not well-controlled with heartburn as the main symptom.  She reports that she has been out of pantoprazole and famotidine for about the last 2 weeks.  She reports this occurred about the same time as her asthma began to flare.  Atopic  dermatitis is reported as moderately well-controlled with occasional red and itchy areas occurring on her abdomen.  She continues a daily moisturizing routine and occasionally uses triamcinolone 0.1% ointment with relief of symptoms.  She does report that about 1 or 2 weeks ago after swimming in a pool, she developed a rough, red, itchy areas on both arms with the right more than the left.  She denies concomitant cardiopulmonary or gastrointestinal symptoms with this rash.  She reports that she used triamcinolone with relief of symptoms.  She continues to experience large local redness after insect bites. She denies cardiopulmonary or gastrointestinal symptoms with the local swelling.   Her current medications are listed in the chart.  Drug Allergies:  No Known Allergies  Physical Exam: BP 128/84   Pulse 96   Temp 98.2 F (36.8 C)   Resp 20   Wt (!) 346 lb 6 oz (157.1 kg)   SpO2 95%   BMI 60.40 kg/m    Physical Exam Vitals reviewed.  Constitutional:      Appearance: Normal appearance.  HENT:     Head: Normocephalic and atraumatic.     Right Ear: Tympanic membrane normal.     Left Ear: Tympanic membrane normal.     Nose:     Comments: Bilateral nares edematous and pale with thin clear nasal drainage noted.  Pharynx normal.  Ears normal.  Eyes normal.    Mouth/Throat:     Pharynx: Oropharynx is clear.  Eyes:     Conjunctiva/sclera: Conjunctivae normal.  Cardiovascular:     Rate and Rhythm: Normal rate  and regular rhythm.     Heart sounds: Normal heart sounds.  Musculoskeletal:     Cervical back: Normal range of motion and neck supple.  Neurological:     Mental Status: She is alert.     Diagnostics: FVC 2.59 which is 73% of predicted value, FEV1 2.15 which is 74% predicted value.  Spirometry indicates possible restriction. This is consistent with previous spirometry readings.   Assessment and Plan: 1. Not well controlled moderate persistent asthma   2. Seasonal and  perennial allergic rhinitis   3. Seasonal allergic conjunctivitis   4. Gastroesophageal reflux disease, unspecified whether esophagitis present   5. History of insect bite     Meds ordered this encounter  Medications   budesonide-formoterol (SYMBICORT) 160-4.5 MCG/ACT inhaler    Sig: Use 2 puffs twice a day with spacer to help prevent cough and wheeze    Dispense:  1 each    Refill:  3   famotidine (PEPCID) 20 MG tablet    Sig: Take 1 tablet (20 mg total) by mouth daily.    Dispense:  30 tablet    Refill:  5   pantoprazole (PROTONIX) 40 MG tablet    Sig: Take 1 tablet (40 mg total) by mouth daily.    Dispense:  30 tablet    Refill:  5   triamcinolone ointment (KENALOG) 0.1 %    Sig: Apply 1 application topically to red and itchy areas under your face up to twice a day as needed    Dispense:  80 g    Refill:  5   fluticasone (FLONASE) 50 MCG/ACT nasal spray    Sig: Place 2 sprays into both nostrils daily as needed for allergies or rhinitis.    Dispense:  16 g    Refill:  5    Patient Instructions  Asthma Continue Symbicort 160 to 2 puffs twice a day every day with a spacer to prevent cough or wheeze Continue albuterol 2 puffs once every 4 hours as needed for cough or wheeze You may use albuterol 2 puffs 5 to 15 minutes before activity to decrease cough or wheeze  Allergic rhinitis Continue allergen avoidance measures directed toward mold and dust mite as listed below Continue levocetirizine 5 mg once a day as needed for runny nose or itch. You may take cetirizine 10 mg once a day as needed for breakthrough symptoms if needed Continue Flonase 2 sprays in each nostril once a day as needed for stuffy nose Consider saline nasal rinses as needed for nasal symptoms. Use this before any medicated nasal sprays for best result Consider a biologic therapy to help control your asthma  Allergic conjunctivitis Begin olopatadine eye drops one drop in each eye once a day as needed for red  or itchy eyes  Reflux Continue dietary and lifestyle modifications as listed below Restart pantoprazole 40 mg once a day to control reflux Restart famotidine 20 mg  once a day to control reflux  Insect bites Apply ice to affected areas Continue cetirizine 10 mg as needed for itch. You may take an additional cetirizine once a day as needed for breakthrough symptoms Apply triamcinolone 0.1% ointment to red and itchy areas under your face up to twice a day as needed  Call the clinic if this treatment plan is not working well for you.  Follow up in the clinic in 3 months or sooner if needed.   Return in about 3 months (around 05/07/2023), or if symptoms worsen or fail  to improve.    Thank you for the opportunity to care for this patient.  Please do not hesitate to contact me with questions.  Thermon Leyland, FNP [facility]

## 2023-05-08 ENCOUNTER — Ambulatory Visit: Payer: Medicaid Other | Admitting: Family Medicine

## 2023-05-08 ENCOUNTER — Encounter: Payer: Self-pay | Admitting: Family Medicine

## 2023-05-08 ENCOUNTER — Telehealth: Payer: Self-pay

## 2023-05-08 VITALS — Ht 63.0 in | Wt 265.0 lb

## 2023-05-08 DIAGNOSIS — H1013 Acute atopic conjunctivitis, bilateral: Secondary | ICD-10-CM

## 2023-05-08 DIAGNOSIS — Z87828 Personal history of other (healed) physical injury and trauma: Secondary | ICD-10-CM

## 2023-05-08 DIAGNOSIS — J45909 Unspecified asthma, uncomplicated: Secondary | ICD-10-CM

## 2023-05-08 DIAGNOSIS — K219 Gastro-esophageal reflux disease without esophagitis: Secondary | ICD-10-CM | POA: Diagnosis not present

## 2023-05-08 DIAGNOSIS — J302 Other seasonal allergic rhinitis: Secondary | ICD-10-CM

## 2023-05-08 DIAGNOSIS — H1010 Acute atopic conjunctivitis, unspecified eye: Secondary | ICD-10-CM

## 2023-05-08 DIAGNOSIS — J4540 Moderate persistent asthma, uncomplicated: Secondary | ICD-10-CM

## 2023-05-08 MED ORDER — SPIRIVA RESPIMAT 1.25 MCG/ACT IN AERS: 2.0000 | INHALATION_SPRAY | Freq: Every day | RESPIRATORY_TRACT | 5 refills | Status: DC

## 2023-05-08 NOTE — Progress Notes (Signed)
RE: Shelley Richardson MRN: [id number] DOB: 03-29-1981 Date of Telemedicine Visit: 05/08/2023  Referring provider: [facility] Primary care provider: [facility]  Chief Complaint: Asthma (Says she is the same ans gained weight. ) and Allergic Rhinitis  (Says she is the same. )   Telemedicine Follow Up Visit via Telephone: I connected with [name] for a follow up on 05/08/23 by telephone and verified that I am speaking with the correct person using two identifiers.   I discussed the limitations, risks, security and privacy concerns of performing an evaluation and management service by telephone and the availability of in person appointments. I also discussed with the patient that there may be a patient responsible charge related to this service. The patient expressed understanding and agreed to proceed.  Patient is at home   Provider is at the office.  Visit start time: 304 Visit end time: 13 Insurance consent/check in by: asha Medical consent and medical assistant/nurse: cree  History of Present Illness: She is a 42 y.o. female, who is being followed for asthma, allergic rhinitis, reflux, and insect bites. Her previous allergy office visit was on 02/04/2023 with Thermon Leyland, FNP.   At today's visit, she reports her asthma has been poorly controlled with symptoms including shortness of breath with activity and rest, and cough that began last night.  She denies wheeze with rest or activity.  She continues Symbicort 160-2 puffs twice a day and has been using albuterol about 3 to 4 days a week with moderate relief of symptoms.  Allergic rhinitis is reported as moderately well-controlled with symptoms including clear rhinorrhea, nasal congestion, occasional sneezing and frequent postnasal drainage.  She has previously taken cetirizine, however, she is currently out of this medication.  He continues Flonase as needed, and occasionally uses [place] nasal  rinse. Her last environmental allergy skin testing was on 07/18/2020 and was positive to mold and dust mite.  Allergic conjunctivitis is reported as moderately well-controlled with occasional red and itchy eyes.  She is not currently taking an allergy medication.  Reflux is reported as moderately well-controlled with occasional heartburn as the main symptom.  She continues pantoprazole 40 mg once a day and famotidine 20 mg daily.  Of note, she does report a recent weight gain.  She continues to experience large local reactions after mosquito bites.  She denies fever with these large local reactions.  Her current medications are listed in the chart.  Assessment and Plan: [name] is a 42 y.o. female with: Patient Instructions  Asthma Begin Spiriva 1.25 mcg-2 puffs once a day to prevent cough or wheeze Continue Symbicort 160- 2 puffs twice a day every day with a spacer to prevent cough or wheeze Continue albuterol 2 puffs once every 4 hours as needed for cough or wheeze You may use albuterol 2 puffs 5 to 15 minutes before activity to decrease cough or wheeze Consider a biologic therapy to help control your asthma  Allergic rhinitis Continue allergen avoidance measures directed toward mold and dust mite as listed below Continue levocetirizine 5 mg once a day as needed for runny nose or itch. You may take cetirizine 10 mg once a day as needed for breakthrough symptoms if needed Continue Flonase 2 sprays in each nostril once a day as needed for stuffy nose Consider saline nasal rinses as needed for nasal symptoms. Use this before any medicated nasal sprays for best result  Allergic conjunctivitis Begin olopatadine eye drops one drop in each eye once  a day as needed for red or itchy eyes  Reflux Continue dietary and lifestyle modifications as listed below Restart pantoprazole 40 mg once a day to control reflux Restart famotidine 20 mg  once a day to control reflux  Insect bites Apply ice  to affected areas Continue cetirizine 10 mg as needed for itch. You may take an additional cetirizine once a day as needed for breakthrough symptoms Apply triamcinolone 0.1% ointment to red and itchy areas under your face up to twice a day as needed  Call the clinic if this treatment plan is not working well for you.  Follow up in the clinic in 3 months or sooner if needed.  Return in about 3 months (around 08/08/2023), or if symptoms worsen or fail to improve.  Meds ordered this encounter  Medications   Tiotropium Bromide Monohydrate (SPIRIVA RESPIMAT) 1.25 MCG/ACT AERS    Sig: Inhale 2 puffs into the lungs daily.    Dispense:  4 g    Refill:  5   Lab Orders  No laboratory test(s) ordered today    Diagnostics: None.  Medication List:  Current Outpatient Medications  Medication Sig Dispense Refill   albuterol (VENTOLIN HFA) 108 (90 Base) MCG/ACT inhaler Inhale 2 puffs every 4-6 hours as needed for cough, wheeze, tightness in chest, or shortness of breath. 18 g 1   budesonide-formoterol (SYMBICORT) 160-4.5 MCG/ACT inhaler Use 2 puffs twice a day with spacer to help prevent cough and wheeze 1 each 3   cetirizine (ZYRTEC) 10 MG tablet Take 1 tablet by mouth once daily 30 tablet 10   diclofenac (VOLTAREN) 75 MG EC tablet Take 75 mg by mouth 2 (two) times daily.     famotidine (PEPCID) 20 MG tablet Take 1 tablet (20 mg total) by mouth daily. 30 tablet 5   fluticasone (FLONASE) 50 MCG/ACT nasal spray Place 2 sprays into both nostrils daily as needed for allergies or rhinitis. 16 g 5   levocetirizine (XYZAL) 5 MG tablet Take 1 tablet (5 mg total) by mouth every evening. 30 tablet 5   pantoprazole (PROTONIX) 40 MG tablet Take 1 tablet (40 mg total) by mouth daily. 30 tablet 5   pregabalin (LYRICA) 50 MG capsule Take 50 mg by mouth 2 (two) times daily.     rosuvastatin (CRESTOR) 10 MG tablet Take 1 tablet by mouth daily.     spironolactone-hydrochlorothiazide (ALDACTAZIDE) 25-25 MG tablet  1-2 daily 60 tablet 11   Tiotropium Bromide Monohydrate (SPIRIVA RESPIMAT) 1.25 MCG/ACT AERS Inhale 2 puffs into the lungs daily. 4 g 5   tiZANidine (ZANAFLEX) 4 MG tablet Take 4 mg by mouth once.     traZODone (DESYREL) 50 MG tablet Take 50 mg by mouth at bedtime.     triamcinolone ointment (KENALOG) 0.1 % Apply 1 application topically to red and itchy areas under your face up to twice a day as needed 80 g 5   No current facility-administered medications for this visit.   Allergies: No Known Allergies I reviewed her past medical history, social history, family history, and environmental history and no significant changes have been reported from previous visit on 02/04/2023.  Objective: Physical Exam Not obtained as encounter was done via telephone.   Previous notes and tests were reviewed.  I discussed the assessment and treatment plan with the patient. The patient was provided an opportunity to ask questions and all were answered. The patient agreed with the plan and demonstrated an understanding of the instructions.   The  patient was advised to call back or seek an in-person evaluation if the symptoms worsen or if the condition fails to improve as anticipated.  I provided 26 minutes of non-face-to-face time during this encounter.  It was my pleasure to participate in Jonathan Dangel care today. Please feel free to contact me with any questions or concerns.   Sincerely,  Thermon Leyland, FNP

## 2023-05-08 NOTE — Telephone Encounter (Signed)
I called the patient and notified her that the Spiriva has been sent into her pharmacy in [place]. Patient confirmed pharmacy and stated she currently has transportation issues and my not be able to pick up the Spiriva sample ready at the [facility] office.

## 2023-05-08 NOTE — Telephone Encounter (Signed)
Thank you :)

## 2023-05-08 NOTE — Patient Instructions (Addendum)
Asthma Begin Spiriva 1.25 mcg-2 puffs once a day to prevent cough or wheeze Continue Symbicort 160- 2 puffs twice a day every day with a spacer to prevent cough or wheeze Continue albuterol 2 puffs once every 4 hours as needed for cough or wheeze You may use albuterol 2 puffs 5 to 15 minutes before activity to decrease cough or wheeze Consider a biologic therapy to help control your asthma  Allergic rhinitis Continue allergen avoidance measures directed toward mold and dust mite as listed below Continue levocetirizine 5 mg once a day as needed for runny nose or itch. You may take cetirizine 10 mg once a day as needed for breakthrough symptoms if needed Continue Flonase 2 sprays in each nostril once a day as needed for stuffy nose Consider saline nasal rinses as needed for nasal symptoms. Use this before any medicated nasal sprays for best result  Allergic conjunctivitis Begin olopatadine eye drops one drop in each eye once a day as needed for red or itchy eyes  Reflux Continue dietary and lifestyle modifications as listed below Restart pantoprazole 40 mg once a day to control reflux Restart famotidine 20 mg  once a day to control reflux  Insect bites Apply ice to affected areas Continue cetirizine 10 mg as needed for itch. You may take an additional cetirizine once a day as needed for breakthrough symptoms Apply triamcinolone 0.1% ointment to red and itchy areas under your face up to twice a day as needed  Call the clinic if this treatment plan is not working well for you.  Follow up in the clinic in 3 months or sooner if needed.   Lifestyle Changes for Controlling GERD When you have GERD, stomach acid feels as if it's backing up toward your mouth. Whether or not you take medication to control your GERD, your symptoms can often be improved with lifestyle changes.   Raise Your Head Reflux is more likely to strike when you're lying down flat, because stomach fluid can flow  backward more easily. Raising the head of your bed 4-6 inches can help. To do this: Slide blocks or books under the legs at the head of your bed. Or, place a wedge under the mattress. Many foam stores can make a suitable wedge for you. The wedge should run from your waist to the top of your head. Don't just prop your head on several pillows. This increases pressure on your stomach. It can make GERD worse.  Watch Your Eating Habits Certain foods may increase the acid in your stomach or relax the lower esophageal sphincter, making GERD more likely. It's best to avoid the following: Coffee, tea, and carbonated drinks (with and without caffeine) Fatty, fried, or spicy food Mint, chocolate, onions, and tomatoes Any other foods that seem to irritate your stomach or cause you pain  Relieve the Pressure Eat smaller meals, even if you have to eat more often. Don't lie down right after you eat. Wait a few hours for your stomach to empty. Avoid tight belts and tight-fitting clothes. Lose excess weight.  Tobacco and Alcohol Avoid smoking tobacco and drinking alcohol. They can make GERD symptoms worse.  Control of Mold Allergen Mold and fungi can grow on a variety of surfaces provided certain temperature and moisture conditions exist.  Outdoor molds grow on plants, decaying vegetation and soil.  The major outdoor mold, Alternaria and Cladosporium, are found in very high numbers during hot and dry conditions.  Generally, a late Summer - Fall peak is  seen for common outdoor fungal spores.  Rain will temporarily lower outdoor mold spore count, but counts rise rapidly when the rainy period ends.  The most important indoor molds are Aspergillus and Penicillium.  Dark, humid and poorly ventilated basements are ideal sites for mold growth.  The next most common sites of mold growth are the bathroom and the kitchen.  Outdoor [employer] Use air conditioning and keep windows closed Avoid exposure to decaying  vegetation. Avoid leaf raking. Avoid grain handling. Consider wearing a face mask if working in moldy areas.  Indoor Mold Control Maintain humidity below 50%. Clean washable surfaces with 5% bleach solution. Remove sources e.g. Contaminated carpets.   Control of Dust Mite Allergen Dust mites play a major role in allergic asthma and rhinitis. They occur in environments with high humidity wherever human skin is found. Dust mites absorb humidity from the atmosphere (ie, they do not drink) and feed on organic matter (including shed human and animal skin). Dust mites are a microscopic type of insect that you cannot see with the naked eye. High levels of dust mites have been detected from mattresses, pillows, carpets, upholstered furniture, bed covers, clothes, soft toys and any woven material. The principal allergen of the dust mite is found in its feces. A gram of dust may contain 1,000 mites and 250,000 fecal particles. Mite antigen is easily measured in the air during house cleaning activities. Dust mites do not bite and do not cause harm to humans, other than by triggering allergies/asthma.  Ways to decrease your exposure to dust mites in your home:  1. Encase mattresses, box springs and pillows with a mite-impermeable barrier or cover  2. Wash sheets, blankets and drapes weekly in hot water (130 F) with detergent and dry them in a dryer on the hot setting.  3. Have the room cleaned frequently with a vacuum cleaner and a damp dust-mop. For carpeting or rugs, vacuuming with a vacuum cleaner equipped with a high-efficiency particulate air (HEPA) filter. The dust mite allergic individual should not be in a room which is being cleaned and should wait 1 hour after cleaning before going into the room.  4. Do not sleep on upholstered furniture (eg, couches).  5. If possible removing carpeting, upholstered furniture and drapery from the home is ideal. Horizontal blinds should be eliminated in the  rooms where the person spends the most time (bedroom, study, television room). Washable vinyl, roller-type shades are optimal.  6. Remove all non-washable stuffed toys from the bedroom. Wash stuffed toys weekly like sheets and blankets above.  7. Reduce indoor humidity to less than 50%. Inexpensive humidity monitors can be purchased at most hardware stores. Do not use a humidifier as can make the problem worse and are not recommended.   Strategies for Safer Mosquito Avoidance  by [employer]one   Mosquitoes are a terrible nuisance in the muggy summer months, especially now that the ferocious Asian tiger mosquito has made a permanent home here in [place]. The arrival of [place] Nile virus has added some urgency to mosquito control measures, but spray programs and many repellents may do more harm than good in the long term. Choosing the least-toxic solutions can protect both your health and comfort in mosquito season. Here are some suggestions for safer and more effective bite avoidance this summer.   Population Control  Keeping mosquito populations in check is the most important way to avoid bites. It's no secret that removing sources of standing water is crucial  to eliminating mosquito breeding grounds. Common breeding sites to watch for include:  * Rain gutters. Clean them out and offer to do the same for elderly neighbors or others who may not be able to do the job themselves. Remember that mosquito control is a community-wide effort.  * Flowerpots, buckets and old tires. Be sure empty containers cannot hold water.  * Bird baths and pet dishes. Empty and clean them weekly.  * Recycling bins and the cans inside. These may harbor stagnant water if not emptied regularly.  * Rain barrels. Be sure they are sealed off from mosquitoes.  * Storm drains. Watch for clogs from branches and garbage.  Insecticide sprays targeting adult mosquitoes can only reduce mosquito populations for a day or two. In  fact, since insecticides also kill off important mosquito predators such as dragonflies, a spray program can actually be counter-productive by leaving the rebounding mosquito population without natural enemies.  Instead, interrupt the breeding cycle by using the nontoxic bacterial larvicide Bacillus thuringiensis var. israelensis (Bti). Bti is sold in convenient donuts called "mosquito dunks" that you can safely use in your bird bath, rain barrel or low areas around your yard to kill mosquito larvae before the adults emerge and spread throughout the community, where they become much harder to kill. Bti is not harmful to fish, birds or mammals, and single applications can remain effective for a month or more, even if the water source dries out and refills.   Safer Repellents  If you'll be outdoors at dawn or dusk when mosquitoes are most active, wear long clothes that don't leave skin exposed. (You may use insect repellent on your clothes). When you do get bites, soothe them by slathering on an astringent such as witch hazel after you come inside -- it will prevent scratching and allow bites to heal quickly.  Lately many public health officials concerned about [place] Nile virus have been advising people to use repellents containing the pesticide DEET (N,N-diethyl-meta-toluamide). While DEET is an extremely effective mosquito repellent, it is also a neurotoxin, and studies have shown that prolonged frequent exposure can irritate skin, cause muscle twitching and weakness and harm the brain and nervous system, especially when combined with other pesticides such as permethrin.  Consumer studies report that [employer]'s Skin-So-Soft and herbal repellents containing citronella can be just as effective as DEET at repelling mosquitoes but need to be applied more often. The solution is to choose the safer formulas and reapply as needed.  General guidelines for using any insect repellent:  * Choose oils or lotions rather than  sprays, which produce fine particles that are easily inhaled.  * Do not apply repellents to broken skin.  * Do not allow children to apply their own repellent, and do not apply repellents containing DEET or other pesticides directly to children's skin. If you use such products, they can be applied to children's clothing instead.  * Do not use sunscreen/repellent combinations. Sunscreen needs to be reapplied more often than repellents, so the combination products can result in overexposure to pesticides.  * Wash off all repellent from skin and clothing immediately after coming indoors.  Area-wide repellent strategies can also be effective for outdoor gatherings. There are various contraptions available that emit carbon dioxide to trap mosquitoes (such as the Mosquito Magnet and Mosquito Deleto). These are expensive, but they do work, and some companies will even rent them to you for an outdoor event. Citronella candles are also effective when there is no breeze,  but beware of candles containing pesticides -- the smoke is easily inhaled and can irritate the airway. Placing fans around your porch or patio can blow mosquitoes away.  Keep in mind that only female mosquitoes actually bite and that most mosquito species in this area do not transmit [place] Nile virus. You are most at risk of being bitten by a mosquito carrying the disease at dawn and dusk, and even in these cases your chances of actually contracting the virus are extremely low. So take sensible steps to keep the buggers under control, but also keep them in perspective as the annoyances they are.

## 2023-08-28 ENCOUNTER — Other Ambulatory Visit: Payer: Self-pay | Admitting: Family Medicine

## 2023-09-27 ENCOUNTER — Other Ambulatory Visit: Payer: Self-pay | Admitting: Family Medicine

## 2023-11-11 ENCOUNTER — Other Ambulatory Visit: Payer: Self-pay | Admitting: Family Medicine

## 2023-11-11 NOTE — Telephone Encounter (Signed)
 Courtesy refill for Xyzal  5 mg and Protonix  40 mg x 1 with no refills sent to [facility]. Patient is due for an OV.

## 2023-12-16 ENCOUNTER — Other Ambulatory Visit: Payer: Self-pay | Admitting: Family Medicine

## 2024-02-07 ENCOUNTER — Other Ambulatory Visit: Payer: Self-pay | Admitting: Family Medicine

## 2024-02-19 ENCOUNTER — Other Ambulatory Visit: Payer: Self-pay | Admitting: Family Medicine

## 2024-04-05 NOTE — Progress Notes (Signed)
   [address] [place] [address] Dept: [phone]  FOLLOW UP NOTE  Patient ID: Shelley Richardson, female    DOB: March 02, 1981  Age: 43 y.o. MRN: [id number] Date of Office Visit: 04/06/2024  Assessment  Chief Complaint: No chief complaint on file.  HPI [name] is a 43 year old female who presents to the clinic for follow-up visit.  She was last seen in this clinic on 05/08/2023 by Arlean Mutter, FNP, for evaluation of poorly controlled asthma, allergic rhinitis, allergic conjunctivitis, reflux, and insect bites.  Her last environmental allergy  skin testing was on 07/18/2020 and was positive to mold and dust mite.   Discussed the use of AI scribe software for clinical note transcription with the patient, who gave verbal consent to proceed.  History of Present Illness      Drug Allergies:  No Known Allergies  Physical Exam: There were no vitals taken for this visit.   Physical Exam  Diagnostics:    Assessment and Plan: No diagnosis found.  No orders of the defined types were placed in this encounter.   There are no Patient Instructions on file for this visit.  No follow-ups on file.    Thank you for the opportunity to care for this patient.  Please do not hesitate to contact me with questions.  Arlean Mutter, FNP [facility]

## 2024-04-05 NOTE — Patient Instructions (Incomplete)
 Asthma Begin Spiriva 1.25 mcg-2 puffs once a day to prevent cough or wheeze Continue Symbicort 160- 2 puffs twice a day every day with a spacer to prevent cough or wheeze Continue albuterol 2 puffs once every 4 hours as needed for cough or wheeze You may use albuterol 2 puffs 5 to 15 minutes before activity to decrease cough or wheeze Consider a biologic therapy to help control your asthma  Allergic rhinitis Continue allergen avoidance measures directed toward mold and dust mite as listed below Continue levocetirizine 5 mg once a day as needed for runny nose or itch. You may take cetirizine 10 mg once a day as needed for breakthrough symptoms if needed Continue Flonase 2 sprays in each nostril once a day as needed for stuffy nose Consider saline nasal rinses as needed for nasal symptoms. Use this before any medicated nasal sprays for best result  Allergic conjunctivitis Begin olopatadine eye drops one drop in each eye once a day as needed for red or itchy eyes  Reflux Continue dietary and lifestyle modifications as listed below Restart pantoprazole 40 mg once a day to control reflux Restart famotidine 20 mg  once a day to control reflux  Insect bites Apply ice to affected areas Continue cetirizine 10 mg as needed for itch. You may take an additional cetirizine once a day as needed for breakthrough symptoms Apply triamcinolone 0.1% ointment to red and itchy areas under your face up to twice a day as needed  Call the clinic if this treatment plan is not working well for you.  Follow up in the clinic in 3 months or sooner if needed.   Lifestyle Changes for Controlling GERD When you have GERD, stomach acid feels as if it's backing up toward your mouth. Whether or not you take medication to control your GERD, your symptoms can often be improved with lifestyle changes.   Raise Your Head Reflux is more likely to strike when you're lying down flat, because stomach fluid can flow  backward more easily. Raising the head of your bed 4-6 inches can help. To do this: Slide blocks or books under the legs at the head of your bed. Or, place a wedge under the mattress. Many foam stores can make a suitable wedge for you. The wedge should run from your waist to the top of your head. Don't just prop your head on several pillows. This increases pressure on your stomach. It can make GERD worse.  Watch Your Eating Habits Certain foods may increase the acid in your stomach or relax the lower esophageal sphincter, making GERD more likely. It's best to avoid the following: Coffee, tea, and carbonated drinks (with and without caffeine) Fatty, fried, or spicy food Mint, chocolate, onions, and tomatoes Any other foods that seem to irritate your stomach or cause you pain  Relieve the Pressure Eat smaller meals, even if you have to eat more often. Don't lie down right after you eat. Wait a few hours for your stomach to empty. Avoid tight belts and tight-fitting clothes. Lose excess weight.  Tobacco and Alcohol Avoid smoking tobacco and drinking alcohol. They can make GERD symptoms worse.  Control of Mold Allergen Mold and fungi can grow on a variety of surfaces provided certain temperature and moisture conditions exist.  Outdoor molds grow on plants, decaying vegetation and soil.  The major outdoor mold, Alternaria and Cladosporium, are found in very high numbers during hot and dry conditions.  Generally, a late Summer - Fall peak is  seen for common outdoor fungal spores.  Rain will temporarily lower outdoor mold spore count, but counts rise rapidly when the rainy period ends.  The most important indoor molds are Aspergillus and Penicillium.  Dark, humid and poorly ventilated basements are ideal sites for mold growth.  The next most common sites of mold growth are the bathroom and the kitchen.  Outdoor [employer] Use air conditioning and keep windows closed Avoid exposure to decaying  vegetation. Avoid leaf raking. Avoid grain handling. Consider wearing a face mask if working in moldy areas.  Indoor Mold Control Maintain humidity below 50%. Clean washable surfaces with 5% bleach solution. Remove sources e.g. Contaminated carpets.   Control of Dust Mite Allergen Dust mites play a major role in allergic asthma and rhinitis. They occur in environments with high humidity wherever human skin is found. Dust mites absorb humidity from the atmosphere (ie, they do not drink) and feed on organic matter (including shed human and animal skin). Dust mites are a microscopic type of insect that you cannot see with the naked eye. High levels of dust mites have been detected from mattresses, pillows, carpets, upholstered furniture, bed covers, clothes, soft toys and any woven material. The principal allergen of the dust mite is found in its feces. A gram of dust may contain 1,000 mites and 250,000 fecal particles. Mite antigen is easily measured in the air during house cleaning activities. Dust mites do not bite and do not cause harm to humans, other than by triggering allergies/asthma.  Ways to decrease your exposure to dust mites in your home:  1. Encase mattresses, box springs and pillows with a mite-impermeable barrier or cover  2. Wash sheets, blankets and drapes weekly in hot water (130 F) with detergent and dry them in a dryer on the hot setting.  3. Have the room cleaned frequently with a vacuum cleaner and a damp dust-mop. For carpeting or rugs, vacuuming with a vacuum cleaner equipped with a high-efficiency particulate air (HEPA) filter. The dust mite allergic individual should not be in a room which is being cleaned and should wait 1 hour after cleaning before going into the room.  4. Do not sleep on upholstered furniture (eg, couches).  5. If possible removing carpeting, upholstered furniture and drapery from the home is ideal. Horizontal blinds should be eliminated in the  rooms where the person spends the most time (bedroom, study, television room). Washable vinyl, roller-type shades are optimal.  6. Remove all non-washable stuffed toys from the bedroom. Wash stuffed toys weekly like sheets and blankets above.  7. Reduce indoor humidity to less than 50%. Inexpensive humidity monitors can be purchased at most hardware stores. Do not use a humidifier as can make the problem worse and are not recommended.   Strategies for Safer Mosquito Avoidance  by [employer] Drone   Mosquitoes are a terrible nuisance in the muggy summer months, especially now that the ferocious Asian tiger mosquito has made a permanent home here in [place]. The arrival of [place] Nile virus has added some urgency to mosquito control measures, but spray programs and many repellents may do more harm than good in the long term. Choosing the least-toxic solutions can protect both your health and comfort in mosquito season. Here are some suggestions for safer and more effective bite avoidance this summer.   Population Control  Keeping mosquito populations in check is the most important way to avoid bites. It's no secret that removing sources of standing water is crucial  to eliminating mosquito breeding grounds. Common breeding sites to watch for include:  * Rain gutters. Clean them out and offer to do the same for elderly neighbors or others who may not be able to do the job themselves. Remember that mosquito control is a community-wide effort.  * Flowerpots, buckets and old tires. Be sure empty containers cannot hold water.  * Bird baths and pet dishes. Empty and clean them weekly.  * Recycling bins and the cans inside. These may harbor stagnant water if not emptied regularly.  * Rain barrels. Be sure they are sealed off from mosquitoes.  * Storm drains. Watch for clogs from branches and garbage.  Insecticide sprays targeting adult mosquitoes can only reduce mosquito populations for a day or two. In  fact, since insecticides also kill off important mosquito predators such as dragonflies, a spray program can actually be counter-productive by leaving the rebounding mosquito population without natural enemies.  Instead, interrupt the breeding cycle by using the nontoxic bacterial larvicide Bacillus thuringiensis var. israelensis (Bti). Bti is sold in convenient donuts called "mosquito dunks" that you can safely use in your bird bath, rain barrel or low areas around your yard to kill mosquito larvae before the adults emerge and spread throughout the community, where they become much harder to kill. Bti is not harmful to fish, birds or mammals, and single applications can remain effective for a month or more, even if the water source dries out and refills.   Safer Repellents  If you'll be outdoors at dawn or dusk when mosquitoes are most active, wear long clothes that don't leave skin exposed. (You may use insect repellent on your clothes). When you do get bites, soothe them by slathering on an astringent such as witch hazel after you come inside -- it will prevent scratching and allow bites to heal quickly.  Lately many public health officials concerned about [place] Nile virus have been advising people to use repellents containing the pesticide DEET (N,N-diethyl-meta-toluamide). While DEET is an extremely effective mosquito repellent, it is also a neurotoxin, and studies have shown that prolonged frequent exposure can irritate skin, cause muscle twitching and weakness and harm the brain and nervous system, especially when combined with other pesticides such as permethrin.  Consumer studies report that [employer]'s Skin-So-Soft and herbal repellents containing citronella can be just as effective as DEET at repelling mosquitoes but need to be applied more often. The solution is to choose the safer formulas and reapply as needed.  General guidelines for using any insect repellent:  * Choose oils or lotions rather than  sprays, which produce fine particles that are easily inhaled.  * Do not apply repellents to broken skin.  * Do not allow children to apply their own repellent, and do not apply repellents containing DEET or other pesticides directly to children's skin. If you use such products, they can be applied to children's clothing instead.  * Do not use sunscreen/repellent combinations. Sunscreen needs to be reapplied more often than repellents, so the combination products can result in overexposure to pesticides.  * Wash off all repellent from skin and clothing immediately after coming indoors.  Area-wide repellent strategies can also be effective for outdoor gatherings. There are various contraptions available that emit carbon dioxide to trap mosquitoes (such as the Mosquito Magnet and Mosquito Deleto). These are expensive, but they do work, and some companies will even rent them to you for an outdoor event. Citronella candles are also effective when there is no breeze,  but beware of candles containing pesticides -- the smoke is easily inhaled and can irritate the airway. Placing fans around your porch or patio can blow mosquitoes away.  Keep in mind that only female mosquitoes actually bite and that most mosquito species in this area do not transmit [place] Nile virus. You are most at risk of being bitten by a mosquito carrying the disease at dawn and dusk, and even in these cases your chances of actually contracting the virus are extremely low. So take sensible steps to keep the buggers under control, but also keep them in perspective as the annoyances they are.

## 2024-04-06 ENCOUNTER — Encounter: Payer: Self-pay | Admitting: Family Medicine

## 2024-04-06 ENCOUNTER — Other Ambulatory Visit: Payer: Self-pay

## 2024-04-06 ENCOUNTER — Ambulatory Visit: Admitting: Family Medicine

## 2024-04-06 VITALS — BP 120/80 | HR 98 | Temp 97.2°F | Resp 18 | Ht <= 58 in | Wt 385.4 lb

## 2024-04-06 DIAGNOSIS — J3089 Other allergic rhinitis: Secondary | ICD-10-CM

## 2024-04-06 DIAGNOSIS — H1013 Acute atopic conjunctivitis, bilateral: Secondary | ICD-10-CM | POA: Diagnosis not present

## 2024-04-06 DIAGNOSIS — J4540 Moderate persistent asthma, uncomplicated: Secondary | ICD-10-CM | POA: Diagnosis not present

## 2024-04-06 DIAGNOSIS — J302 Other seasonal allergic rhinitis: Secondary | ICD-10-CM

## 2024-04-06 DIAGNOSIS — Z87828 Personal history of other (healed) physical injury and trauma: Secondary | ICD-10-CM | POA: Diagnosis not present

## 2024-04-06 DIAGNOSIS — H1010 Acute atopic conjunctivitis, unspecified eye: Secondary | ICD-10-CM

## 2024-04-06 DIAGNOSIS — K219 Gastro-esophageal reflux disease without esophagitis: Secondary | ICD-10-CM

## 2024-04-06 MED ORDER — FLUTICASONE PROPIONATE 50 MCG/ACT NA SUSP: 2.0000 | Freq: Every day | NASAL | 5 refills | Status: AC | PRN

## 2024-04-06 MED ORDER — PANTOPRAZOLE SODIUM 40 MG PO TBEC: DELAYED_RELEASE_TABLET | ORAL | 2 refills | Status: AC

## 2024-04-06 MED ORDER — SPIRIVA RESPIMAT 1.25 MCG/ACT IN AERS: 2.0000 | INHALATION_SPRAY | Freq: Every day | RESPIRATORY_TRACT | 5 refills | Status: AC

## 2024-04-06 MED ORDER — BUDESONIDE-FORMOTEROL FUMARATE 160-4.5 MCG/ACT IN AERO: INHALATION_SPRAY | RESPIRATORY_TRACT | 3 refills | Status: AC

## 2024-04-06 MED ORDER — ALBUTEROL SULFATE HFA 108 (90 BASE) MCG/ACT IN AERS
INHALATION_SPRAY | RESPIRATORY_TRACT | 1 refills | Status: AC
Start: 2024-04-06 — End: ?

## 2024-04-06 MED ORDER — LEVOCETIRIZINE DIHYDROCHLORIDE 5 MG PO TABS: 5.0000 mg | ORAL_TABLET | Freq: Every evening | ORAL | 0 refills | Status: DC

## 2024-04-06 MED ORDER — FAMOTIDINE 20 MG PO TABS: ORAL_TABLET | ORAL | 2 refills | Status: AC

## 2024-05-21 ENCOUNTER — Other Ambulatory Visit: Payer: Self-pay | Admitting: Family Medicine

## 2024-07-08 ENCOUNTER — Other Ambulatory Visit: Payer: Self-pay

## 2024-07-08 ENCOUNTER — Ambulatory Visit: Admitting: Family Medicine

## 2024-07-08 ENCOUNTER — Encounter: Payer: Self-pay | Admitting: Family Medicine

## 2024-07-08 VITALS — BP 142/98 | HR 113 | Temp 98.2°F | Resp 18 | Ht 63.0 in | Wt 397.0 lb

## 2024-07-08 DIAGNOSIS — K219 Gastro-esophageal reflux disease without esophagitis: Secondary | ICD-10-CM

## 2024-07-08 DIAGNOSIS — H1010 Acute atopic conjunctivitis, unspecified eye: Secondary | ICD-10-CM | POA: Diagnosis not present

## 2024-07-08 DIAGNOSIS — J302 Other seasonal allergic rhinitis: Secondary | ICD-10-CM

## 2024-07-08 DIAGNOSIS — J3089 Other allergic rhinitis: Secondary | ICD-10-CM | POA: Diagnosis not present

## 2024-07-08 DIAGNOSIS — J4540 Moderate persistent asthma, uncomplicated: Secondary | ICD-10-CM

## 2024-07-08 MED ORDER — CROMOLYN SODIUM 4 % OP SOLN: 2.0000 [drp] | Freq: Four times a day (QID) | OPHTHALMIC | 5 refills | Status: AC | PRN

## 2024-07-08 NOTE — Progress Notes (Signed)
 "  [address] [place] [address] Dept: [phone]  FOLLOW UP NOTE  Patient ID: Shelley Richardson, female    DOB: 05-19-1981  Age: 44 y.o. MRN: [id number] Date of Office Visit: 07/08/2024  Assessment  Chief Complaint: Follow-up (Pt believes she is exposed to indoor mold. Eyes closed shut, burning, watery, itchiness that come and go during the week.)  HPI Shelley Richardson is a 44 year old female who presents to the clinic for follow-up visit.  She was last seen in this clinic on 04/06/2024 by Shelley Mutter, FNP, for evaluation of not well-controlled asthma, allergic rhinitis, allergic conjunctivitis, and poorly controlled reflux.    Discussed the use of AI scribe software for clinical note transcription with the patient, who gave verbal consent to proceed.  History of Present Illness Tashanna Dolin is a 44 year old female with mold allergy  who presents with eye irritation and respiratory symptoms.  She has been experiencing eye irritation, including pain, itching, and difficulty keeping her eyes open, several times over the past month. Financial constraints have prevented her from using allergy  eye drops.  She reports respiratory symptoms such as coughing, sore throat, shortness of breath, and wheezing that occur intermittently. Her cough sometimes produces mucus of varying color. She uses Symbicort  160 and Spiriva  a couple of times a week but relies more heavily on albuterol , using it daily, especially when experiencing wheezing during activities like walking her dog. She experiences panic attacks and increased heart rate when in the shower, despite lowering the water temperature.  She reports difficulty affording her asthma medications.  She takes famotidine  once daily for reflux, occasionally taking it at night if she forgets in the morning. She also uses pantoprazole  but sometimes forgets to take her medications due to other stressors.  She reports  that when she takes her medicines her reflux is well-controlled.  Allergic rhinitis is reported as moderately well-controlled with occasional symptoms including sneezing and clear rhinorrhea.  She continues either levocetirizine or cetirizine  on most days is not currently using Flonase  or nasal saline rinses. Her last environmental allergy  skin testing was on 07/18/2020 and was positive to mold and dust mite.  We discussed allergen immunotherapy in detail including risks and benefit, schedule, and duration of treatment.  Written information was provided at today's visit.  Her current medications are listed in the chart.  Drug Allergies:  Allergies[1]  Physical Exam: BP (!) 142/98 (BP Location: Left Wrist, Patient Position: Sitting)   Pulse (!) 113   Temp 98.2 F (36.8 C) (Temporal)   Resp 18   Ht 5' 3 (1.6 m)   Wt (!) 397 lb (180.1 kg)   SpO2 95%   BMI 70.33 kg/m    Physical Exam Vitals reviewed.  Constitutional:      Appearance: Normal appearance.  HENT:     Head: Normocephalic and atraumatic.     Right Ear: Tympanic membrane normal.     Left Ear: Tympanic membrane normal.     Nose:     Comments: Bilateral nares slightly erythematous with thin clear nasal drainage noted.  Pharynx normal.  Ears normal.  Eyes normal.    Mouth/Throat:     Pharynx: Oropharynx is clear.  Eyes:     Conjunctiva/sclera: Conjunctivae normal.  Cardiovascular:     Rate and Rhythm: Normal rate and regular rhythm.     Heart sounds: Normal heart sounds. No murmur heard. Pulmonary:     Effort: Pulmonary effort is normal.  Breath sounds: Normal breath sounds.     Comments: Lungs clear to auscultation Musculoskeletal:        General: Normal range of motion.     Cervical back: Normal range of motion and neck supple.  Skin:    General: Skin is warm and dry.  Neurological:     Mental Status: She is alert and oriented to person, place, and time.  Psychiatric:        Mood and Affect: Mood normal.         Behavior: Behavior normal.        Thought Content: Thought content normal.        Judgment: Judgment normal.     Diagnostics: FVC 2.25 which is 64% OF PREDICTED VALUE. Fev1 1.97 WHICH IS 69% OF PREDICTED VALUE. sPIROMETRY INDICATES POSSIBLE RESTRICTION.   Assessment and Plan: 1. Not well controlled moderate persistent asthma   2. Seasonal and perennial allergic rhinitis   3. Seasonal allergic conjunctivitis   4. Gastroesophageal reflux disease, unspecified whether esophagitis present     Meds ordered this encounter  Medications   cromolyn  (OPTICROM ) 4 % ophthalmic solution    Sig: Place 2 drops into both eyes 4 (four) times daily as needed.    Dispense:  10 mL    Refill:  5    Patient Instructions  Asthma Continue Spiriva  1.25 mcg daily-2 puffs once a day to prevent cough or wheeze Continue Symbicort  160- 2 puffs twice a day every day with a spacer to prevent cough or wheeze Continue albuterol  2 puffs once every 4 hours as needed for cough or wheeze You may use albuterol  2 puffs 5 to 15 minutes before activity to decrease cough or wheeze Consider a biologic therapy to help control your asthma  Allergic rhinitis Continue allergen avoidance measures directed toward mold and dust mite as listed below Continue levocetirizine 5 mg once a day as needed for runny nose or itch.  Continue Flonase  2 sprays in each nostril once a day as needed for stuffy nose Consider saline nasal rinses as needed for nasal symptoms. Use this before any medicated nasal sprays for best result Consider allergy  injections if your symptoms are not well controlled with the treatment plan as listed above  Allergic conjunctivitis Begin cromolyn  1-2 drops in each eye up to 4 times a day  Reflux Continue dietary and lifestyle modifications as listed below Continue pantoprazole  40 mg once a day to control reflux Continue famotidine  20 mg  once a day to control reflux  Call the clinic if this treatment  plan is not working well for you.  Follow up in the clinic in 3 months or sooner if needed.  Return in about 3 months (around 10/06/2024), or if symptoms worsen or fail to improve.    Thank you for the opportunity to care for this patient.  Please do not hesitate to contact me with questions.  Shelley Mutter, FNP [facility]          [1] No Known Allergies  "

## 2024-07-08 NOTE — Patient Instructions (Addendum)
 Asthma Continue Spiriva  1.25 mcg daily-2 puffs once a day to prevent cough or wheeze Continue Symbicort  160- 2 puffs twice a day every day with a spacer to prevent cough or wheeze Continue albuterol  2 puffs once every 4 hours as needed for cough or wheeze You may use albuterol  2 puffs 5 to 15 minutes before activity to decrease cough or wheeze Consider a biologic therapy to help control your asthma  Allergic rhinitis Continue allergen avoidance measures directed toward mold and dust mite as listed below Continue levocetirizine 5 mg once a day as needed for runny nose or itch.  Continue Flonase  2 sprays in each nostril once a day as needed for stuffy nose Consider saline nasal rinses as needed for nasal symptoms. Use this before any medicated nasal sprays for best result Consider allergy  injections if your symptoms are not well controlled with the treatment plan as listed above  Allergic conjunctivitis Begin cromolyn  1-2 drops in each eye up to 4 times a day  Reflux Continue dietary and lifestyle modifications as listed below Continue pantoprazole  40 mg once a day to control reflux Continue famotidine  20 mg  once a day to control reflux  Call the clinic if this treatment plan is not working well for you.  Follow up in the clinic in 3 months or sooner if needed.   Lifestyle Changes for Controlling GERD When you have GERD, stomach acid feels as if its backing up toward your mouth. Whether or not you take medication to control your GERD, your symptoms can often be improved with lifestyle changes.   Raise Your Head Reflux is more likely to strike when youre lying down flat, because stomach fluid can flow backward more easily. Raising the head of your bed 4-6 inches can help. To do this: Slide blocks or books under the legs at the head of your bed. Or, place a wedge under the mattress. Many foam stores can make a suitable wedge for you. The wedge should run from your waist to the top  of your head. Dont just prop your head on several pillows. This increases pressure on your stomach. It can make GERD worse.  Watch Your Eating Habits Certain foods may increase the acid in your stomach or relax the lower esophageal sphincter, making GERD more likely. Its best to avoid the following: Coffee, tea, and carbonated drinks (with and without caffeine) Fatty, fried, or spicy food Mint, chocolate, onions, and tomatoes Any other foods that seem to irritate your stomach or cause you pain  Relieve the Pressure Eat smaller meals, even if you have to eat more often. Dont lie down right after you eat. Wait a few hours for your stomach to empty. Avoid tight belts and tight-fitting clothes. Lose excess weight.  Tobacco and Alcohol Avoid smoking tobacco and drinking alcohol. They can make GERD symptoms worse.  Control of Mold Allergen Mold and fungi can grow on a variety of surfaces provided certain temperature and moisture conditions exist.  Outdoor molds grow on plants, decaying vegetation and soil.  The major outdoor mold, Alternaria and Cladosporium, are found in very high numbers during hot and dry conditions.  Generally, a late Summer - Fall peak is seen for common outdoor fungal spores.  Rain will temporarily lower outdoor mold spore count, but counts rise rapidly when the rainy period ends.  The most important indoor molds are Aspergillus and Penicillium.  Dark, humid and poorly ventilated basements are ideal sites for mold growth.  The next most common  sites of mold growth are the bathroom and the kitchen.  Outdoor [employer] Use air conditioning and keep windows closed Avoid exposure to decaying vegetation. Avoid leaf raking. Avoid grain handling. Consider wearing a face mask if working in moldy areas.  Indoor Mold Control Maintain humidity below 50%. Clean washable surfaces with 5% bleach solution. Remove sources e.g. Contaminated carpets.   Control of Dust Mite  Allergen Dust mites play a major role in allergic asthma and rhinitis. They occur in environments with high humidity wherever human skin is found. Dust mites absorb humidity from the atmosphere (ie, they do not drink) and feed on organic matter (including shed human and animal skin). Dust mites are a microscopic type of insect that you cannot see with the naked eye. High levels of dust mites have been detected from mattresses, pillows, carpets, upholstered furniture, bed covers, clothes, soft toys and any woven material. The principal allergen of the dust mite is found in its feces. A gram of dust may contain 1,000 mites and 250,000 fecal particles. Mite antigen is easily measured in the air during house cleaning activities. Dust mites do not bite and do not cause harm to humans, other than by triggering allergies/asthma.  Ways to decrease your exposure to dust mites in your home:  1. Encase mattresses, box springs and pillows with a mite-impermeable barrier or cover  2. Wash sheets, blankets and drapes weekly in hot water (130 F) with detergent and dry them in a dryer on the hot setting.  3. Have the room cleaned frequently with a vacuum cleaner and a damp dust-mop. For carpeting or rugs, vacuuming with a vacuum cleaner equipped with a high-efficiency particulate air (HEPA) filter. The dust mite allergic individual should not be in a room which is being cleaned and should wait 1 hour after cleaning before going into the room.  4. Do not sleep on upholstered furniture (eg, couches).  5. If possible removing carpeting, upholstered furniture and drapery from the home is ideal. Horizontal blinds should be eliminated in the rooms where the person spends the most time (bedroom, study, television room). Washable vinyl, roller-type shades are optimal.  6. Remove all non-washable stuffed toys from the bedroom. Wash stuffed toys weekly like sheets and blankets above.  7. Reduce indoor humidity to less than  50%. Inexpensive humidity monitors can be purchased at most hardware stores. Do not use a humidifier as can make the problem worse and are not recommended.

## 2024-07-22 NOTE — Addendum Note (Signed)
 Addended by: IVA MARTY SALTNESS on: 07/22/2024 05:10 PM   Modules accepted: Orders

## 2024-07-27 NOTE — Progress Notes (Signed)
 Aeroallergen Immunotherapy  Ordering Provider: Marty Shaggy, MD  Patient Details Name: Ayeshia Coppin MRN: [id number] Date of Birth: 03/22/1981  Order 1 of 1  Vial Label: Molds/DM  0.2 ml (Volume)  1:20 Concentration -- Alternaria alternata 0.2 ml (Volume)  1:20 Concentration -- Cladosporium herbarum 0.2 ml (Volume)  1:10 Concentration -- Fusarium moniliforme 0.2 ml (Volume)  1:40 Concentration -- Aureobasidium pullulans 0.2 ml (Volume)  1:10 Concentration -- Rhizopus oryzae 1.0 ml (Volume)   AU Concentration -- Mite Mix (DF 5,000 & DP 5,000)   2.0  ml Extract Subtotal 3.0  ml Diluent 5.0  ml Maintenance Total  Schedule:  B  Silver Vial (1:10,000): Schedule B (6 doses) Green Vial (1:1,000): Schedule B (6 doses) Blue Vial (1:100): Schedule B (6 doses) Yellow Vial (1:10): Schedule B (6 doses) Red Vial (1:1): Schedule A (14 doses)  Special Instructions: After completion of the first Red Vial, please space to every two weeks. After completion of the second Red Vial, please space to every 4 weeks. Ok to up dose new vials on Schedule D. Ok to come twice weekly, if desired, as long as there is 48 hours between injections.

## 2024-07-27 NOTE — Progress Notes (Signed)
 VIAL MADE ON 07/27/24

## 2024-08-05 ENCOUNTER — Ambulatory Visit

## 2024-09-26 ENCOUNTER — Ambulatory Visit: Admitting: Family Medicine
